# Patient Record
Sex: Female | Born: 1975 | Race: White | Hispanic: No | Marital: Married | Smoking: Never smoker
Health system: Southern US, Community
[De-identification: ages and names within clinical notes are randomized; demographics above are authoritative.]

## PROBLEM LIST (undated history)

## (undated) DIAGNOSIS — Z789 Other specified health status: Secondary | ICD-10-CM

## (undated) HISTORY — PX: WISDOM TOOTH EXTRACTION: SHX21

## (undated) HISTORY — DX: Other specified health status: Z78.9

## (undated) HISTORY — PX: TONSILLECTOMY: SUR1361

---

## 2004-07-07 DIAGNOSIS — O321XX0 Maternal care for breech presentation, not applicable or unspecified: Secondary | ICD-10-CM

## 2007-06-09 ENCOUNTER — Inpatient Hospital Stay (HOSPITAL_COMMUNITY): Admission: AD | Admit: 2007-06-09 | Discharge: 2007-06-11 | Payer: Self-pay | Admitting: Obstetrics and Gynecology

## 2012-07-16 LAB — OB RESULTS CONSOLE GC/CHLAMYDIA
Chlamydia: NEGATIVE
Gonorrhea: NEGATIVE

## 2012-07-16 LAB — OB RESULTS CONSOLE ABO/RH
ABO/RH(D): POSITIVE
RH TYPE: POSITIVE

## 2012-07-16 LAB — OB RESULTS CONSOLE HIV ANTIBODY (ROUTINE TESTING): HIV: NONREACTIVE

## 2012-07-16 LAB — OB RESULTS CONSOLE ANTIBODY SCREEN: Antibody Screen: NEGATIVE

## 2012-07-16 LAB — OB RESULTS CONSOLE RUBELLA ANTIBODY, IGM: Rubella: IMMUNE

## 2012-07-16 LAB — OB RESULTS CONSOLE HEPATITIS B SURFACE ANTIGEN: Hepatitis B Surface Ag: NEGATIVE

## 2012-07-23 LAB — OB RESULTS CONSOLE RPR: RPR: NONREACTIVE

## 2013-01-08 LAB — OB RESULTS CONSOLE GBS: GBS: NEGATIVE

## 2013-02-09 ENCOUNTER — Encounter (HOSPITAL_COMMUNITY): Payer: Self-pay | Admitting: *Deleted

## 2013-02-09 ENCOUNTER — Inpatient Hospital Stay (HOSPITAL_COMMUNITY)
Admission: AD | Admit: 2013-02-09 | Discharge: 2013-02-11 | DRG: 774 | Disposition: A | Discharge status: Home / Self Care | Payer: Medicaid Other | Source: Ambulatory Visit | Attending: Obstetrics and Gynecology | Admitting: Obstetrics and Gynecology

## 2013-02-09 DIAGNOSIS — O9903 Anemia complicating the puerperium: Secondary | ICD-10-CM | POA: Diagnosis not present

## 2013-02-09 DIAGNOSIS — D649 Anemia, unspecified: Secondary | ICD-10-CM | POA: Diagnosis not present

## 2013-02-09 DIAGNOSIS — IMO0001 Reserved for inherently not codable concepts without codable children: Secondary | ICD-10-CM

## 2013-02-09 DIAGNOSIS — O99893 Other specified diseases and conditions complicating puerperium: Secondary | ICD-10-CM | POA: Diagnosis not present

## 2013-02-09 DIAGNOSIS — O9989 Other specified diseases and conditions complicating pregnancy, childbirth and the puerperium: Secondary | ICD-10-CM

## 2013-02-09 DIAGNOSIS — O34219 Maternal care for unspecified type scar from previous cesarean delivery: Secondary | ICD-10-CM | POA: Diagnosis present

## 2013-02-09 DIAGNOSIS — O09529 Supervision of elderly multigravida, unspecified trimester: Secondary | ICD-10-CM | POA: Diagnosis present

## 2013-02-09 DIAGNOSIS — R000 Tachycardia, unspecified: Secondary | ICD-10-CM | POA: Diagnosis not present

## 2013-02-09 DIAGNOSIS — I959 Hypotension, unspecified: Secondary | ICD-10-CM | POA: Diagnosis not present

## 2013-02-09 MED ORDER — ONDANSETRON HCL 4 MG/2ML IJ SOLN: 4.0000 mg | Freq: Four times a day (QID) | INTRAMUSCULAR | Status: DC | PRN

## 2013-02-09 MED ORDER — CITRIC ACID-SODIUM CITRATE 334-500 MG/5ML PO SOLN: 30.0000 mL | ORAL | Status: DC | PRN

## 2013-02-09 MED ORDER — FLEET ENEMA 7-19 GM/118ML RE ENEM: 1.0000 | ENEMA | RECTAL | Status: DC | PRN

## 2013-02-09 MED ORDER — LIDOCAINE HCL (PF) 1 % IJ SOLN
30.0000 mL | INTRAMUSCULAR | Status: AC | PRN
  Administered 2013-02-10 (×2): 30 mL via SUBCUTANEOUS
  Filled 2013-02-09 (×2): qty 30

## 2013-02-09 MED ORDER — ACETAMINOPHEN 325 MG PO TABS: 650.0000 mg | ORAL_TABLET | ORAL | Status: DC | PRN

## 2013-02-09 MED ORDER — OXYTOCIN BOLUS FROM INFUSION
500.0000 mL | INTRAVENOUS | Status: DC
  Administered 2013-02-10: 500 mL via INTRAVENOUS

## 2013-02-09 MED ORDER — LACTATED RINGERS IV SOLN
500.0000 mL | INTRAVENOUS | Status: DC | PRN
  Administered 2013-02-10: 500 mL via INTRAVENOUS

## 2013-02-09 MED ORDER — LACTATED RINGERS IV SOLN
INTRAVENOUS | Status: DC
  Administered 2013-02-10 (×2): via INTRAVENOUS

## 2013-02-09 MED ORDER — OXYTOCIN 40 UNITS IN LACTATED RINGERS INFUSION - SIMPLE MED
62.5000 mL/h | INTRAVENOUS | Status: DC
  Filled 2013-02-09: qty 1000

## 2013-02-09 MED ORDER — IBUPROFEN 600 MG PO TABS
600.0000 mg | ORAL_TABLET | Freq: Four times a day (QID) | ORAL | Status: DC | PRN
  Administered 2013-02-10: 600 mg via ORAL
  Filled 2013-02-09 (×2): qty 1

## 2013-02-09 MED ORDER — OXYCODONE-ACETAMINOPHEN 5-325 MG PO TABS: 1.0000 | ORAL_TABLET | ORAL | Status: DC | PRN

## 2013-02-09 NOTE — MAU Note (Signed)
Pt in for labor evaluation, having contractions every 2 min.

## 2013-02-10 ENCOUNTER — Encounter (HOSPITAL_COMMUNITY): Payer: Self-pay | Admitting: Obstetrics

## 2013-02-10 LAB — CBC
HCT: 43.6 % (ref 36.0–46.0)
HEMATOCRIT: 31.8 % — AB (ref 36.0–46.0)
HEMATOCRIT: 29.7 % — AB (ref 36.0–46.0)
Hemoglobin: 10.5 g/dL — ABNORMAL LOW (ref 12.0–15.0)
Hemoglobin: 11.2 g/dL — ABNORMAL LOW (ref 12.0–15.0)
Hemoglobin: 15.7 g/dL — ABNORMAL HIGH (ref 12.0–15.0)
MCH: 29.7 pg (ref 26.0–34.0)
MCH: 30.6 pg (ref 26.0–34.0)
MCH: 30.7 pg (ref 26.0–34.0)
MCHC: 35.2 g/dL (ref 30.0–36.0)
MCHC: 35.4 g/dL (ref 30.0–36.0)
MCHC: 36 g/dL (ref 30.0–36.0)
MCV: 84.1 fL (ref 78.0–100.0)
MCV: 85 fL (ref 78.0–100.0)
MCV: 87.1 fL (ref 78.0–100.0)
PLATELETS: 328 10*3/uL (ref 150–400)
Platelets: 316 10*3/uL (ref 150–400)
Platelets: 195 10*3/uL (ref 150–400)
RBC: 3.53 MIL/uL — ABNORMAL LOW (ref 3.87–5.11)
RBC: 3.65 MIL/uL — ABNORMAL LOW (ref 3.87–5.11)
RBC: 5.13 MIL/uL — ABNORMAL HIGH (ref 3.87–5.11)
RDW: 12.4 % (ref 11.5–15.5)
RDW: 12.4 % (ref 11.5–15.5)
RDW: 13.7 % (ref 11.5–15.5)
WBC: 15.3 10*3/uL — AB (ref 4.0–10.5)
WBC: 19.8 10*3/uL — ABNORMAL HIGH (ref 4.0–10.5)
WBC: 34.1 10*3/uL — AB (ref 4.0–10.5)

## 2013-02-10 LAB — DIC (DISSEMINATED INTRAVASCULAR COAGULATION)PANEL
Fibrinogen: 306 mg/dL (ref 204–475)
INR: 1.12 (ref 0.00–1.49)
Prothrombin Time: 14.2 seconds (ref 11.6–15.2)
aPTT: 26 seconds (ref 24–37)

## 2013-02-10 LAB — DIC (DISSEMINATED INTRAVASCULAR COAGULATION) PANEL
D DIMER QUANT: 0.97 ug{FEU}/mL — AB (ref 0.00–0.48)
PLATELETS: 316 10*3/uL (ref 150–400)
SMEAR REVIEW: NONE SEEN

## 2013-02-10 LAB — PREPARE RBC (CROSSMATCH)

## 2013-02-10 LAB — POSTPARTUM HEMORRHAGE PROTOCOL (BB NOTIFICATION)

## 2013-02-10 LAB — RPR: RPR Ser Ql: NONREACTIVE

## 2013-02-10 LAB — ABO/RH: ABO/RH(D): A POS

## 2013-02-10 MED ORDER — SIMETHICONE 80 MG PO CHEW: 80.0000 mg | CHEWABLE_TABLET | ORAL | Status: DC | PRN

## 2013-02-10 MED ORDER — METHYLERGONOVINE MALEATE 0.2 MG/ML IJ SOLN
INTRAMUSCULAR | Status: AC
  Administered 2013-02-10: 0.2 mg
  Filled 2013-02-10: qty 1

## 2013-02-10 MED ORDER — ZOLPIDEM TARTRATE 5 MG PO TABS: 5.0000 mg | ORAL_TABLET | Freq: Every evening | ORAL | Status: DC | PRN

## 2013-02-10 MED ORDER — MISOPROSTOL 200 MCG PO TABS
1000.0000 ug | ORAL_TABLET | Freq: Once | ORAL | Status: AC
  Administered 2013-02-10: 1000 ug via RECTAL

## 2013-02-10 MED ORDER — BENZOCAINE-MENTHOL 20-0.5 % EX AERO
1.0000 "application " | INHALATION_SPRAY | CUTANEOUS | Status: DC | PRN
  Filled 2013-02-10: qty 56

## 2013-02-10 MED ORDER — ERYTHROMYCIN 5 MG/GM OP OINT
TOPICAL_OINTMENT | OPHTHALMIC | Status: AC
  Filled 2013-02-10: qty 1

## 2013-02-10 MED ORDER — LACTATED RINGERS IV BOLUS (SEPSIS)
500.0000 mL | Freq: Once | INTRAVENOUS | Status: AC
  Administered 2013-02-10: 500 mL via INTRAVENOUS

## 2013-02-10 MED ORDER — PROMETHAZINE HCL 25 MG/ML IJ SOLN
12.5000 mg | Freq: Once | INTRAMUSCULAR | Status: AC
  Administered 2013-02-10: 12.5 mg via INTRAVENOUS
  Filled 2013-02-10: qty 1

## 2013-02-10 MED ORDER — MEASLES, MUMPS & RUBELLA VAC ~~LOC~~ INJ
0.5000 mL | INJECTION | Freq: Once | SUBCUTANEOUS | Status: DC
Start: 2013-02-11 — End: 2013-02-10

## 2013-02-10 MED ORDER — DIBUCAINE 1 % RE OINT: 1.0000 "application " | TOPICAL_OINTMENT | RECTAL | Status: DC | PRN

## 2013-02-10 MED ORDER — BUTORPHANOL TARTRATE 1 MG/ML IJ SOLN
INTRAMUSCULAR | Status: AC
  Administered 2013-02-10: 1 mg via INTRAVENOUS
  Filled 2013-02-10: qty 1

## 2013-02-10 MED ORDER — CEFAZOLIN SODIUM-DEXTROSE 2-3 GM-% IV SOLR
2.0000 g | Freq: Once | INTRAVENOUS | Status: AC
  Administered 2013-02-10: 2 g via INTRAVENOUS
  Filled 2013-02-10: qty 50

## 2013-02-10 MED ORDER — ONDANSETRON HCL 4 MG PO TABS
4.0000 mg | ORAL_TABLET | ORAL | Status: DC | PRN
Start: 2013-02-10 — End: 2013-02-11

## 2013-02-10 MED ORDER — LANOLIN HYDROUS EX OINT
TOPICAL_OINTMENT | CUTANEOUS | Status: DC | PRN
Start: 2013-02-10 — End: 2013-02-11

## 2013-02-10 MED ORDER — OXYCODONE-ACETAMINOPHEN 5-325 MG PO TABS: 1.0000 | ORAL_TABLET | ORAL | Status: DC | PRN

## 2013-02-10 MED ORDER — FERROUS SULFATE 325 (65 FE) MG PO TABS
325.0000 mg | ORAL_TABLET | Freq: Two times a day (BID) | ORAL | Status: DC
  Administered 2013-02-10 – 2013-02-11 (×2): 325 mg via ORAL
  Filled 2013-02-10 (×2): qty 1

## 2013-02-10 MED ORDER — BUTORPHANOL TARTRATE 1 MG/ML IJ SOLN
1.0000 mg | Freq: Once | INTRAMUSCULAR | Status: AC
  Administered 2013-02-10: 1 mg via INTRAVENOUS

## 2013-02-10 MED ORDER — IBUPROFEN 600 MG PO TABS
600.0000 mg | ORAL_TABLET | Freq: Four times a day (QID) | ORAL | Status: DC
  Administered 2013-02-10 – 2013-02-11 (×4): 600 mg via ORAL
  Filled 2013-02-10 (×4): qty 1

## 2013-02-10 MED ORDER — TETANUS-DIPHTH-ACELL PERTUSSIS 5-2.5-18.5 LF-MCG/0.5 IM SUSP: 0.5000 mL | Freq: Once | INTRAMUSCULAR | Status: DC

## 2013-02-10 MED ORDER — MISOPROSTOL 200 MCG PO TABS
ORAL_TABLET | ORAL | Status: AC
  Filled 2013-02-10: qty 5

## 2013-02-10 MED ORDER — ONDANSETRON HCL 4 MG/2ML IJ SOLN: 4.0000 mg | INTRAMUSCULAR | Status: DC | PRN

## 2013-02-10 MED ORDER — DIPHENHYDRAMINE HCL 25 MG PO CAPS
25.0000 mg | ORAL_CAPSULE | Freq: Four times a day (QID) | ORAL | Status: DC | PRN
Start: 2013-02-10 — End: 2013-02-11

## 2013-02-10 MED ORDER — PRENATAL MULTIVITAMIN CH
1.0000 | ORAL_TABLET | Freq: Every day | ORAL | Status: DC
  Administered 2013-02-10 – 2013-02-11 (×2): 1 via ORAL
  Filled 2013-02-10 (×2): qty 1

## 2013-02-10 MED ORDER — WITCH HAZEL-GLYCERIN EX PADS: 1.0000 "application " | MEDICATED_PAD | CUTANEOUS | Status: DC | PRN

## 2013-02-10 MED ORDER — SENNOSIDES-DOCUSATE SODIUM 8.6-50 MG PO TABS
2.0000 | ORAL_TABLET | ORAL | Status: DC
  Administered 2013-02-10: 2 via ORAL
  Filled 2013-02-10: qty 2

## 2013-02-10 NOTE — Progress Notes (Addendum)
Blood transfusion initiated. Consent obtained by patient. Dr. Normand Sloopillard requesting for blood to be transfused rapidly. House supervisor and CRNA at bedside to assist.

## 2013-02-10 NOTE — Progress Notes (Addendum)
Patient ID: Erin LarocheJessica S Bridges, female   DOB: April 07, 1975, 38 y.o.   MRN: [id number] Called to help [name] with third degree laceration.  Pt had 1000 cc blood loss.  After repair she became hypotensive and tachycardic BP 76/35  Pulse 132  Temp(Src) 97.7 F (36.5 C) (Axillary)  Resp 20  Ht 5\' 10"  (1.778 m)  Wt 215 lb (97.523 kg)  BMI 30.85 kg/m2  SpO2 86% ABD fundus firm 2 below umbilicus Vagina another 500 cc clot came out of vagina Pt given 1000 mcg cytotec 0.2mg  methergine And  40 mu pitocin Uterus explored and another 500 cc clot  Removed with small retained products CBC    Component Value Date/Time   WBC 34.1* 02/10/2013 0250   RBC 3.65* 02/10/2013 0250   HGB 11.2* 02/10/2013 0250   HCT 31.8* 02/10/2013 0250   PLT 316 02/10/2013 0250   MCV 87.1 02/10/2013 0250   MCH 30.7 02/10/2013 0250   MCHC 35.2 02/10/2013 0250   RDW 12.4 02/10/2013 0250    PP Hemmorrhage Pt given two units PRBCS Two IV sites with fluid started.   Pt denies any pain I dont think it is a uterine rupture Total blood loss 2000cc Lab called ans stated lab was 7.  Nurse came into the room and stated the patients hemoglobin is 7.  After review of the lab her HGB is 11.2

## 2013-02-10 NOTE — Progress Notes (Signed)
Code hemorrhage called. DIC panel drawn as well. BB notified of 2 units to be type and crossed. Second IV being obtained. Pt NPO. O2 administered via face mask. Anesthesia and house supervisor at bedside along with attending Dr Normand Sloopillard and Gevena BarreS Lillard CNM

## 2013-02-10 NOTE — Lactation Note (Signed)
This note was copied from the chart of Erin York SpanielJessica Allerton. Lactation Consultation Note: Initial visit with mom. Baby fussy- Dad putting her clothes on. Reports that baby has been feeding on and off for the last hour. Mom reports there is some pain with latch- asking about baby's tongue. Baby only able to lift tongue slightly and tongue does not come out past gums. Encouraged to ask Ped about this. Encouraged mom to drink plenty of fluids due to blood loss. Experienced BF mom. No questions at present. Encouraged to watch for feeding cues and feed whenever she sees them. LS 8 by RN. BF brochure given with resources for support after DC. To call for assist prn  Patient Name: Erin Bridges [id number]'s Date: 02/10/2013 Reason for consult: Initial assessment   Maternal Data Formula Feeding for Exclusion: No Infant to breast within first hour of birth: Yes Does the patient have breastfeeding experience prior to this delivery?: Yes  Feeding Feeding Type: Breast Fed  LATCH Score/Interventions  Lactation Tools Discussed/Used     Consult Status Consult Status: Follow-up Date: 02/11/13 Follow-up type: In-patient    Pamelia HoitWeeks, Ismelda Weatherman D 02/10/2013, 4:24 PM

## 2013-02-10 NOTE — Progress Notes (Signed)
Postpartum hemorrhage protocol in place. Awaiting cbc. LR bolusing per Dr Orvan Julyillards orders. O2 administered. Monitoring BP. PR cytotec given per dr Normand Sloopdillard.

## 2013-02-10 NOTE — H&P (Signed)
[name] is a 38 y.o. female presenting for reg strong ctx, now 2-3 min apart. Denies VB or LOF, GFM.   Maternal Medical History:  Reason for admission: Contractions.   Contractions: Onset was 3-5 hours ago.   Frequency: regular.   Perceived severity is strong.    Fetal activity: Perceived fetal activity is normal.   Last perceived fetal movement was within the past hour.    Prenatal complications: no prenatal complications Prenatal Complications - Diabetes: none.    OB History   Grav Para Term Preterm Abortions TAB SAB Ect Mult Living   3 3 3       3      History reviewed. No pertinent past medical history. Past Surgical History  Procedure Laterality Date  . Cesarean section    . Tonsillectomy    . Wisdom tooth extraction     Family History: family history is not on file. Social History:  reports that she has never smoked. She does not have any smokeless tobacco history on file. She reports that she does not drink alcohol or use illicit drugs.   Prenatal Transfer Tool  Maternal Diabetes: No Genetic Screening: Normal Maternal Ultrasounds/Referrals: Normal Fetal Ultrasounds or other Referrals:  None Maternal Substance Abuse:  No Significant Maternal Medications:  no Significant Maternal Lab Results:  Lab values include: Group B Strep negative Other Comments:  None  Review of Systems  All other systems reviewed and are negative.    Dilation: 10 Effacement (%): 100 Station: Crowning Exam by:: Izaah Westman cnm Blood pressure 92/66, pulse 133, temperature 97.4 F (36.3 C), temperature source Oral, resp. rate 18, height 5\' 10"  (1.778 m), weight 215 lb (97.523 kg), SpO2 99.00%, unknown if currently breastfeeding. Maternal Exam:  Uterine Assessment: Contraction strength is firm.  Contraction frequency is regular.   Abdomen: Patient reports no abdominal tenderness. Fundal height is aga.   Estimated fetal weight is 7-8.   Fetal presentation: vertex  Introitus:  Normal vulva. Normal vagina.  Pelvis: adequate for delivery.   Cervix: Cervix evaluated by digital exam.     Fetal Exam Fetal Monitor Review: Mode: ultrasound.    Fetal State Assessment: Category I - tracings are normal.     Physical Exam  Nursing note and vitals reviewed. Constitutional: She is oriented to person, place, and time. She appears well-developed and well-nourished. She appears distressed.  HENT:  Head: Normocephalic.  Eyes: Pupils are equal, round, and reactive to light.  Neck: Normal range of motion.  Cardiovascular: Normal rate, regular rhythm and normal heart sounds.   Respiratory: Effort normal and breath sounds normal.  GI: Soft. Bowel sounds are normal.  Genitourinary: Vagina normal.  Musculoskeletal: Normal range of motion.  Neurological: She is alert and oriented to person, place, and time. She has normal reflexes.  Skin: Skin is warm and dry.  Psychiatric: She has a normal mood and affect. Her behavior is normal.    Prenatal labs: ABO, Rh: --/--/A POS, A POS (01/05 0000) Antibody: NEG (01/05 0000) Rubella: Immune (06/10 0000) RPR: Nonreactive (06/17 0000)  HBsAg: Negative (06/10 0000)  HIV: Non-reactive (06/10 0000)  GBS: Negative (12/03 0000)   Assessment/Plan: IUP at 784w2d Active labor GBS neg FHR cat 1 Declines medications Hx c/s, and subsequent VBAC  Admit to b.s per c/w Dr Normand Sloopillard Routine L&D orders Imminent delivery    Razia Screws M 02/10/2013, 5:48 AM

## 2013-02-11 LAB — TYPE AND SCREEN
ABO/RH(D): A POS
ANTIBODY SCREEN: NEGATIVE
UNIT DIVISION: 0
Unit division: 0

## 2013-02-11 LAB — CBC
HCT: 24 % — ABNORMAL LOW (ref 36.0–46.0)
Hemoglobin: 8.5 g/dL — ABNORMAL LOW (ref 12.0–15.0)
MCH: 30.5 pg (ref 26.0–34.0)
MCHC: 35.4 g/dL (ref 30.0–36.0)
MCV: 86 fL (ref 78.0–100.0)
PLATELETS: 187 10*3/uL (ref 150–400)
RBC: 2.79 MIL/uL — ABNORMAL LOW (ref 3.87–5.11)
RDW: 14.1 % (ref 11.5–15.5)
WBC: 13.9 10*3/uL — AB (ref 4.0–10.5)

## 2013-02-11 MED ORDER — IBUPROFEN 600 MG PO TABS: 600.0000 mg | ORAL_TABLET | Freq: Four times a day (QID) | ORAL | Status: DC | PRN

## 2013-02-11 MED ORDER — OXYCODONE-ACETAMINOPHEN 5-325 MG PO TABS: 1.0000 | ORAL_TABLET | Freq: Four times a day (QID) | ORAL | Status: DC | PRN

## 2013-02-11 NOTE — Discharge Summary (Addendum)
Obstetric Discharge Summary Reason for Admission: onset of labor Prenatal Procedures: ultrasound Intrapartum Procedures: spontaneous vaginal delivery Postpartum Procedures: none Complications-Operative and Postpartum: none Hemoglobin  Date Value Range Status  02/11/2013 8.5* 12.0 - 15.0 g/dL Final     REPEATED TO VERIFY     DELTA CHECK NOTED     HCT  Date Value Range Status  02/11/2013 24.0* 36.0 - 46.0 % Final   Breast feeding.  Undecided about BC.  Asymptomatic anemia.  Pt interested in discharge today.  Physical Exam:  General: alert and no distress Lochia: appropriate Uterine Fundus: firm DVT Evaluation: No evidence of DVT seen on physical exam.  Discharge Diagnoses: Term Pregnancy-delivered  Discharge Information: Date: 02/11/2013 Activity: pelvic rest Diet: routine Medications: Ibuprofen and Percocet Condition: stable Instructions: refer to practice specific booklet Discharge to: home Follow-up Information   [facility] 6 weeks. (for post partum visit)    Specialty:  Obstetrics and Gynecology   Contact information:   [address] [place] [id number] [phone]      Newborn Data: Live born female  Birth Weight: 8 lb 0.8 oz (3651 g) APGAR: 9, 9  Home with mother.  Erin Bridges Y 02/11/2013, 11:57 AM

## 2013-12-08 ENCOUNTER — Encounter (HOSPITAL_COMMUNITY): Payer: Self-pay | Admitting: Obstetrics

## 2015-02-07 NOTE — L&D Delivery Note (Signed)
Delivery Note Patient complete and pushing. At 10:06 PM patient had VBAC of viable female infant over intact perineum. Nuchal cord x 1 reduced. Infant delivered to mom's abdomen. Delayed cord clamping x 1 minute. Cord clamped x 2, cut. Spontaneous cry heard. Weight and Apgars 8, 9. Cord blood obtained. Placenta delivered spontaneously and intact. LUS cleared of clot and retained membane Vagina inspected.2nd  degree lacerations noted. Repaired with 3-0 Vicryl Rapide EBL: 300 cc Anesthesia: epidural, local  Mom to postpartum.  Baby to Couplet care / Skin to Skin.  Erin Bridges 11/06/2015, 10:24 PM

## 2015-03-24 LAB — OB RESULTS CONSOLE GC/CHLAMYDIA
CHLAMYDIA, DNA PROBE: NEGATIVE
GC PROBE AMP, GENITAL: NEGATIVE

## 2015-03-24 LAB — OB RESULTS CONSOLE HIV ANTIBODY (ROUTINE TESTING): HIV: NONREACTIVE

## 2015-03-24 LAB — OB RESULTS CONSOLE HEPATITIS B SURFACE ANTIGEN: Hepatitis B Surface Ag: NEGATIVE

## 2015-03-24 LAB — OB RESULTS CONSOLE RPR: RPR: NONREACTIVE

## 2015-04-29 LAB — CYTOLOGY - PAP: PAP SMEAR: NEGATIVE

## 2015-08-12 LAB — OB RESULTS CONSOLE HEPATITIS B SURFACE ANTIGEN: Hepatitis B Surface Ag: NEGATIVE

## 2015-08-12 LAB — GLUCOSE TOLERANCE, 1 HOUR: GTT, 1 HR: 92

## 2015-08-12 LAB — OB RESULTS CONSOLE HGB/HCT, BLOOD
HEMATOCRIT: 38 %
HEMOGLOBIN: 12.5 g/dL

## 2015-08-12 LAB — OB RESULTS CONSOLE RPR: RPR: NONREACTIVE

## 2015-08-12 LAB — OB RESULTS CONSOLE ANTIBODY SCREEN: Antibody Screen: NEGATIVE

## 2015-08-12 LAB — OB RESULTS CONSOLE PLATELET COUNT: PLATELETS: 280 10*3/uL

## 2015-08-12 LAB — OB RESULTS CONSOLE GC/CHLAMYDIA
CHLAMYDIA, DNA PROBE: NEGATIVE
GC PROBE AMP, GENITAL: NEGATIVE

## 2015-08-12 LAB — OB RESULTS CONSOLE ABO/RH: RH Type: POSITIVE

## 2015-08-12 LAB — OB RESULTS CONSOLE RUBELLA ANTIBODY, IGM: Rubella: IMMUNE

## 2015-09-14 ENCOUNTER — Encounter: Payer: Self-pay | Admitting: Certified Nurse Midwife

## 2015-09-14 ENCOUNTER — Ambulatory Visit (INDEPENDENT_AMBULATORY_CARE_PROVIDER_SITE_OTHER): Payer: Medicaid Other | Admitting: Certified Nurse Midwife

## 2015-09-14 VITALS — BP 105/61 | HR 61 | Temp 97.1°F | Wt 217.4 lb

## 2015-09-14 DIAGNOSIS — Z3483 Encounter for supervision of other normal pregnancy, third trimester: Secondary | ICD-10-CM

## 2015-09-14 DIAGNOSIS — O0993 Supervision of high risk pregnancy, unspecified, third trimester: Secondary | ICD-10-CM

## 2015-09-14 DIAGNOSIS — B373 Candidiasis of vulva and vagina: Secondary | ICD-10-CM

## 2015-09-14 DIAGNOSIS — Z3490 Encounter for supervision of normal pregnancy, unspecified, unspecified trimester: Secondary | ICD-10-CM | POA: Insufficient documentation

## 2015-09-14 DIAGNOSIS — B3731 Acute candidiasis of vulva and vagina: Secondary | ICD-10-CM

## 2015-09-14 LAB — POCT URINALYSIS DIPSTICK
Bilirubin, UA: NEGATIVE
Blood, UA: NEGATIVE
Glucose, UA: NEGATIVE
KETONES UA: NEGATIVE
Leukocytes, UA: NEGATIVE
Nitrite, UA: NEGATIVE
Protein, UA: NEGATIVE
Spec Grav, UA: 1.015
Urobilinogen, UA: 0.2
pH, UA: 6

## 2015-09-14 MED ORDER — TERCONAZOLE 0.8 % VA CREA: 1.0000 | TOPICAL_CREAM | Freq: Every day | VAGINAL | 0 refills | Status: DC

## 2015-09-14 MED ORDER — FLUCONAZOLE 100 MG PO TABS: 100.0000 mg | ORAL_TABLET | Freq: Once | ORAL | 0 refills | Status: AC

## 2015-09-14 NOTE — Progress Notes (Signed)
Pt c/o vaginal irritation/burning. She also c/o minimal pain/pressure.

## 2015-09-14 NOTE — Progress Notes (Signed)
Subjective:    Erin LarocheJessica S Bridges is being seen today for her first obstetrical visit.  This is not a planned pregnancy. She is at 192w4d gestation. Her obstetrical history is significant for advanced maternal age. Relationship with FOB: spouse, living together. Patient does intend to breast feed. Pregnancy history fully reviewed.  The information documented in the HPI was reviewed and verified.  Menstrual History: OB History    Gravida Para Term Preterm AB Living   4 3 3     3    SAB TAB Ectopic Multiple Live Births           3    VBAC with 2nd and 3rd deliveries.  1st child was planned C-section: breech.  8#1 oz child last time, was not induced.     Patient's last menstrual period was 01/22/2015 (approximate).    Past Medical History:  Diagnosis Date  . Medical history non-contributory     Past Surgical History:  Procedure Laterality Date  . CESAREAN SECTION    . TONSILLECTOMY    . WISDOM TOOTH EXTRACTION       (Not in a hospital admission) No Known Allergies  Social History  Substance Use Topics  . Smoking status: Never Smoker  . Smokeless tobacco: Never Used  . Alcohol use No    Family History  Problem Relation Age of Onset  . Cancer Mother   . Hypertension Mother   . Diverticulitis Father   . Tuberculosis Maternal Grandmother   . Heart disease Maternal Grandfather      Review of Systems Constitutional: negative for weight loss Gastrointestinal: negative for vomiting Genitourinary:negative for genital lesions and vaginal discharge and dysuria Musculoskeletal:negative for back pain Behavioral/Psych: negative for abusive relationship, depression, illegal drug usage and tobacco use    Objective:    BP 105/61   Pulse 61   Temp 97.1 F (36.2 C)   Wt 217 lb 6.4 oz (98.6 kg)   LMP 01/22/2015 (Approximate)   BMI 31.19 kg/m  General Appearance:    Alert, cooperative, no distress, appears stated age  Head:    Normocephalic, without obvious abnormality,  atraumatic  Eyes:    PERRL, conjunctiva/corneas clear, EOM's intact, fundi    benign, both eyes  Ears:    Normal TM's and external ear canals, both ears  Nose:   Nares normal, septum midline, mucosa normal, no drainage    or sinus tenderness  Throat:   Lips, mucosa, and tongue normal; teeth and gums normal  Neck:   Supple, symmetrical, trachea midline, no adenopathy;    thyroid:  no enlargement/tenderness/nodules; no carotid   bruit or JVD  Back:     Symmetric, no curvature, ROM normal, no CVA tenderness  Lungs:     Clear to auscultation bilaterally, respirations unlabored  Chest Wall:    No tenderness or deformity   Heart:    Regular rate and rhythm, S1 and S2 normal, no murmur, rub   or gallop  Breast Exam:    No tenderness, masses, or nipple abnormality  Abdomen:     Soft, non-tender, bowel sounds active all four quadrants,    no masses, no organomegaly  Genitalia:    Normal female without lesion, discharge or tenderness, Vaginal irritation present, + chunky white discharge noted, no odor.   Extremities:   Extremities normal, atraumatic, no cyanosis or edema  Pulses:   2+ and symmetric all extremities  Skin:   Skin color, texture, turgor normal, no rashes or lesions  Lymph nodes:  Cervical, supraclavicular, and axillary nodes normal  Neurologic:   CNII-XII intact, normal strength, sensation and reflexes    throughout      Cervix:  0 .5 cm, long, thick, posterior.  FHR: 148 by doppler, FH: 33 cm  Lab Review Urine pregnancy test Labs reviewed yes Radiologic studies reviewed yes Assessment:    Pregnancy at [redacted]w[redacted]d weeks   Transfer from [facility]    Vaginal yeast infection  Plan:      Prenatal vitamins.  Counseling provided regarding continued use of seat belts, cessation of alcohol consumption, smoking or use of illicit drugs; infection precautions i.e., influenza/TDAP immunizations, toxoplasmosis,CMV, parvovirus, listeria and varicella; workplace safety, exercise during  pregnancy; routine dental care, safe medications, sexual activity, hot tubs, saunas, pools, travel, caffeine use, fish and methlymercury, potential toxins, hair treatments, varicose veins Weight gain recommendations per IOM guidelines reviewed: underweight/BMI< 18.5--> gain 28 - 40 lbs; normal weight/BMI 18.5 - 24.9--> gain 25 - 35 lbs; overweight/BMI 25 - 29.9--> gain 15 - 25 lbs; obese/BMI >30->gain  11 - 20 lbs Problem list reviewed and updated. FIRST/CF mutation testing/NIPT/QUAD SCREEN/fragile X/Ashkenazi Jewish population testing/Spinal muscular atrophy discussed: declined. Role of ultrasound in pregnancy discussed; fetal survey: ordered. Amniocentesis discussed: not indicated. VBAC calculator score: VBAC consent form provided No orders of the defined types were placed in this encounter.  No orders of the defined types were placed in this encounter.   Follow up in 2 weeks.  50% of 30 min visit spent on counseling and coordination of care.

## 2015-09-17 LAB — NUSWAB VG+, CANDIDA 6SP
CANDIDA KRUSEI, NAA: NEGATIVE
Candida albicans, NAA: NEGATIVE
Candida glabrata, NAA: NEGATIVE
Candida lusitaniae, NAA: NEGATIVE
Candida parapsilosis, NAA: NEGATIVE
Candida tropicalis, NAA: NEGATIVE
Chlamydia trachomatis, NAA: NEGATIVE
Neisseria gonorrhoeae, NAA: NEGATIVE
TRICH VAG BY NAA: NEGATIVE

## 2015-09-21 ENCOUNTER — Encounter (HOSPITAL_COMMUNITY): Payer: Self-pay | Admitting: Obstetrics

## 2015-09-21 ENCOUNTER — Encounter (HOSPITAL_COMMUNITY): Payer: Self-pay | Admitting: Certified Nurse Midwife

## 2015-09-28 ENCOUNTER — Other Ambulatory Visit: Payer: Self-pay | Admitting: Certified Nurse Midwife

## 2015-09-28 ENCOUNTER — Encounter: Payer: Medicaid Other | Admitting: Certified Nurse Midwife

## 2015-09-28 ENCOUNTER — Ambulatory Visit (INDEPENDENT_AMBULATORY_CARE_PROVIDER_SITE_OTHER): Payer: Medicaid Other | Admitting: Certified Nurse Midwife

## 2015-09-28 ENCOUNTER — Encounter (HOSPITAL_COMMUNITY): Payer: Self-pay

## 2015-09-28 ENCOUNTER — Ambulatory Visit (HOSPITAL_COMMUNITY)
Admission: RE | Admit: 2015-09-28 | Discharge: 2015-09-28 | Disposition: A | Discharge status: Home / Self Care | Payer: Medicaid Other | Source: Ambulatory Visit | Attending: Certified Nurse Midwife | Admitting: Certified Nurse Midwife

## 2015-09-28 VITALS — BP 124/66 | HR 74 | Wt 220.0 lb

## 2015-09-28 DIAGNOSIS — Z3A35 35 weeks gestation of pregnancy: Secondary | ICD-10-CM | POA: Diagnosis not present

## 2015-09-28 DIAGNOSIS — O34219 Maternal care for unspecified type scar from previous cesarean delivery: Secondary | ICD-10-CM

## 2015-09-28 DIAGNOSIS — O0993 Supervision of high risk pregnancy, unspecified, third trimester: Secondary | ICD-10-CM

## 2015-09-28 DIAGNOSIS — Z3483 Encounter for supervision of other normal pregnancy, third trimester: Secondary | ICD-10-CM

## 2015-09-28 DIAGNOSIS — O09523 Supervision of elderly multigravida, third trimester: Secondary | ICD-10-CM | POA: Diagnosis not present

## 2015-09-28 LAB — POCT URINALYSIS DIPSTICK
Bilirubin, UA: NEGATIVE
Glucose, UA: NEGATIVE
Ketones, UA: NEGATIVE
LEUKOCYTES UA: NEGATIVE
NITRITE UA: NEGATIVE
PH UA: 7
Protein, UA: NEGATIVE
RBC UA: NEGATIVE
Spec Grav, UA: <=1.005
UROBILINOGEN UA: NEGATIVE

## 2015-09-28 NOTE — Progress Notes (Signed)
Subjective:    Erin Bridges is a 40 y.o. female being seen today for her obstetrical visit. She is at 876w3d gestation. Patient reports no complaints. Fetal movement: normal.  Problem List Items Addressed This Visit    None    Visit Diagnoses   None.    Patient Active Problem List   Diagnosis Date Noted  . Encounter for supervision of other normal pregnancy in third trimester 09/14/2015  . NSVD (normal spontaneous vaginal delivery) 02/10/2013  . PPH (postpartum hemorrhage) 02/10/2013  . Vaginal delivery 02/10/2013   Objective:    BP 124/66   Pulse 74   Wt 220 lb (99.8 kg)   LMP 01/22/2015 (Approximate)   BMI 31.57 kg/m  FHT:  140 BPM  Uterine Size: 35 cm and size equals dates  Presentation: cephalic     Assessment:    Pregnancy @ 676w3d weeks   AMA  Doing well  Plan:     labs reviewed, problem list updated Consent signed for TOLAC. GBS planning for next week per patients request TDAP offered  [facility] Pediatrician: discussed. Infant feeding: plans to breastfeed. Maternity leave: N/A. Cigarette smoking: never smoked. No orders of the defined types were placed in this encounter.  No orders of the defined types were placed in this encounter.  Follow up in 1 Week with NST & GBS.

## 2015-09-28 NOTE — Addendum Note (Signed)
Addended by: Marya LandryFOSTER, Calil Amor D on: 09/28/2015 11:26 AM   Modules accepted: Orders

## 2015-09-28 NOTE — Patient Instructions (Addendum)
Third Trimester of Pregnancy The third trimester is from week 29 through week 42, months 7 through 9. The third trimester is a time when the fetus is growing rapidly. At the end of the ninth month, the fetus is about 20 inches in length and weighs 6-10 pounds.  BODY CHANGES Your body goes through many changes during pregnancy. The changes vary from woman to woman.   Your weight will continue to increase. You can expect to gain 25-35 pounds (11-16 kg) by the end of the pregnancy.  You may begin to get stretch marks on your hips, abdomen, and breasts.  You may urinate more often because the fetus is moving lower into your pelvis and pressing on your bladder.  You may develop or continue to have heartburn as a result of your pregnancy.  You may develop constipation because certain hormones are causing the muscles that push waste through your intestines to slow down.  You may develop hemorrhoids or swollen, bulging veins (varicose veins).  You may have pelvic pain because of the weight gain and pregnancy hormones relaxing your joints between the bones in your pelvis. Backaches may result from overexertion of the muscles supporting your posture.  You may have changes in your hair. These can include thickening of your hair, rapid growth, and changes in texture. Some women also have hair loss during or after pregnancy, or hair that feels dry or thin. Your hair will most likely return to normal after your baby is born.  Your breasts will continue to grow and be tender. A yellow discharge may leak from your breasts called colostrum.  Your belly button may stick out.  You may feel short of breath because of your expanding uterus.  You may notice the fetus "dropping," or moving lower in your abdomen.  You may have a bloody mucus discharge. This usually occurs a few days to a week before labor begins.  Your cervix becomes thin and soft (effaced) near your due date. WHAT TO EXPECT AT YOUR PRENATAL  EXAMS  You will have prenatal exams every 2 weeks until week 36. Then, you will have weekly prenatal exams. During a routine prenatal visit:  You will be weighed to make sure you and the fetus are growing normally.  Your blood pressure is taken.  Your abdomen will be measured to track your baby's growth.  The fetal heartbeat will be listened to.  Any test results from the previous visit will be discussed.  You may have a cervical check near your due date to see if you have effaced. At around 36 weeks, your caregiver will check your cervix. At the same time, your caregiver will also perform a test on the secretions of the vaginal tissue. This test is to determine if a type of bacteria, Group B streptococcus, is present. Your caregiver will explain this further. Your caregiver may ask you:  What your birth plan is.  How you are feeling.  If you are feeling the baby move.  If you have had any abnormal symptoms, such as leaking fluid, bleeding, severe headaches, or abdominal cramping.  If you are using any tobacco products, including cigarettes, chewing tobacco, and electronic cigarettes.  If you have any questions. Other tests or screenings that may be performed during your third trimester include:  Blood tests that check for low iron levels (anemia).  Fetal testing to check the health, activity level, and growth of the fetus. Testing is done if you have certain medical conditions or if   there are problems during the pregnancy.  HIV (human immunodeficiency virus) testing. If you are at high risk, you may be screened for HIV during your third trimester of pregnancy. FALSE LABOR You may feel small, irregular contractions that eventually go away. These are called Braxton Hicks contractions, or false labor. Contractions may last for hours, days, or even weeks before true labor sets in. If contractions come at regular intervals, intensify, or become painful, it is best to be seen by your  caregiver.  SIGNS OF LABOR   Menstrual-like cramps.  Contractions that are 5 minutes apart or less.  Contractions that start on the top of the uterus and spread down to the lower abdomen and back.  A sense of increased pelvic pressure or back pain.  A watery or bloody mucus discharge that comes from the vagina. If you have any of these signs before the 37th week of pregnancy, call your caregiver right away. You need to go to the hospital to get checked immediately. HOME CARE INSTRUCTIONS   Avoid all smoking, herbs, alcohol, and unprescribed drugs. These chemicals affect the formation and growth of the baby.  Do not use any tobacco products, including cigarettes, chewing tobacco, and electronic cigarettes. If you need help quitting, ask your health care provider. You may receive counseling support and other resources to help you quit.  Follow your caregiver's instructions regarding medicine use. There are medicines that are either safe or unsafe to take during pregnancy.  Exercise only as directed by your caregiver. Experiencing uterine cramps is a good sign to stop exercising.  Continue to eat regular, healthy meals.  Wear a good support bra for breast tenderness.  Do not use hot tubs, steam rooms, or saunas.  Wear your seat belt at all times when driving.  Avoid raw meat, uncooked cheese, cat litter boxes, and soil used by cats. These carry germs that can cause birth defects in the baby.  Take your prenatal vitamins.  Take 1500-2000 mg of calcium daily starting at the 20th week of pregnancy until you deliver your baby.  Try taking a stool softener (if your caregiver approves) if you develop constipation. Eat more high-fiber foods, such as fresh vegetables or fruit and whole grains. Drink plenty of fluids to keep your urine clear or pale yellow.  Take warm sitz baths to soothe any pain or discomfort caused by hemorrhoids. Use hemorrhoid cream if your caregiver approves.  If  you develop varicose veins, wear support hose. Elevate your feet for 15 minutes, 3-4 times a day. Limit salt in your diet.  Avoid heavy lifting, wear low heal shoes, and practice good posture.  Rest a lot with your legs elevated if you have leg cramps or low back pain.  Visit your dentist if you have not gone during your pregnancy. Use a soft toothbrush to brush your teeth and be gentle when you floss.  A sexual relationship may be continued unless your caregiver directs you otherwise.  Do not travel far distances unless it is absolutely necessary and only with the approval of your caregiver.  Take prenatal classes to understand, practice, and ask questions about the labor and delivery.  Make a trial run to the hospital.  Pack your hospital bag.  Prepare the baby's nursery.  Continue to go to all your prenatal visits as directed by your caregiver. SEEK MEDICAL CARE IF:  You are unsure if you are in labor or if your water has broken.  You have dizziness.  You have   mild pelvic cramps, pelvic pressure, or nagging pain in your abdominal area.  You have persistent nausea, vomiting, or diarrhea.  You have a bad smelling vaginal discharge.  You have pain with urination. SEEK IMMEDIATE MEDICAL CARE IF:   You have a fever.  You are leaking fluid from your vagina.  You have spotting or bleeding from your vagina.  You have severe abdominal cramping or pain.  You have rapid weight loss or gain.  You have shortness of breath with chest pain.  You notice sudden or extreme swelling of your face, hands, ankles, feet, or legs.  You have not felt your baby move in over an hour.  You have severe headaches that do not go away with medicine.  You have vision changes.   This information is not intended to replace advice given to you by your health care provider. Make sure you discuss any questions you have with your health care provider.   Document Released: 01/17/2001 Document  Revised: 02/13/2014 Document Reviewed: 03/26/2012 Elsevier Interactive Patient Education 2016 [employer]  Before Baby Comes Home Before your baby arrives it is important to:  Have all of the supplies that you will need to care for your baby.  Know where to go if there is an emergency.  Discuss the baby's arrival with other family members. WHAT SUPPLIES WILL I NEED? It is recommended that you have the following supplies: Large Items  Crib.  Crib mattress.  Rear-facing infant car seat. If possible, have a trained professional check to make sure that it is installed correctly. Feeding  6-8 bottles that are 4-5 oz in size.  6-8 nipples.  Bottle brush.  Sterilizer, or a large pan or kettle with a lid.  A way to boil and cool water.  If you will be breastfeeding:  Breast pump.  Nipple cream.  Nursing bra.  Breast pads.  Breast shields.  If you will be formula feeding:  Formula.  Measuring cups.  Measuring spoons. Bathing  Mild baby soap and baby shampoo.  Petroleum jelly.  Soft cloth towel and washcloth.  Hooded towel.  Cotton balls.  Bath basin. Other Supplies  Rectal thermometer.  Bulb syringe.  Baby wipes or washcloths for diaper changes.  Diaper bag.  Changing pad.  Clothing, including one-piece outfits and pajamas.  Baby nail clippers.  Receiving blankets.  Mattress pad and sheets for the crib.  Night-light for the baby's room.  Baby monitor.  2 or 3 pacifiers.  Either 24-36 cloth diapers and waterproof diaper covers or a box of disposable diapers. You may need to use as many as 10-12 diapers per day. HOW DO I PREPARE FOR AN EMERGENCY? Prepare for an emergency by:  Knowing how to get to the nearest hospital.  Listing the phone numbers of your baby's health care providers near your home phone and in your cell phone. HOW DO I PREPARE MY FAMILY?  Decide how to handle visitors.  If you have other children:  Talk  with them about the baby coming home. Ask them how they feel about it.  Read a book together about being a new big brother or sister.  Find ways to let them help you prepare for the new baby.  Have someone ready to care for them while you are in the hospital.   This information is not intended to replace advice given to you by your health care provider. Make sure you discuss any questions you have with your health care provider.     Document Released: 01/06/2008 Document Revised: 06/09/2014 Document Reviewed: 12/31/2013 Elsevier Interactive Patient Education 2016 [employer] Inc[employer]  

## 2015-10-05 ENCOUNTER — Encounter: Payer: Self-pay | Admitting: Certified Nurse Midwife

## 2015-10-05 ENCOUNTER — Ambulatory Visit (INDEPENDENT_AMBULATORY_CARE_PROVIDER_SITE_OTHER): Payer: Medicaid Other | Admitting: Certified Nurse Midwife

## 2015-10-05 VITALS — BP 109/65 | HR 75 | Temp 98.3°F | Wt 221.5 lb

## 2015-10-05 DIAGNOSIS — O09523 Supervision of elderly multigravida, third trimester: Secondary | ICD-10-CM

## 2015-10-05 DIAGNOSIS — Z3483 Encounter for supervision of other normal pregnancy, third trimester: Secondary | ICD-10-CM

## 2015-10-05 LAB — OB RESULTS CONSOLE GBS: STREP GROUP B AG: NEGATIVE

## 2015-10-05 NOTE — Progress Notes (Signed)
Patient needs refill on vitamins.

## 2015-10-05 NOTE — Progress Notes (Signed)
Subjective:    Erin Bridges is a 40 y.o. female being seen today for her obstetrical visit. She is at 3368w3d gestation. Patient reports no complaints. Fetal movement: normal.  Problem List Items Addressed This Visit      Other   Encounter for supervision of other normal pregnancy in third trimester - Primary   Relevant Orders   Strep Gp B NAA   NuSwab VG+, Candida 6sp    Other Visit Diagnoses   None.    Patient Active Problem List   Diagnosis Date Noted  . Encounter for supervision of other normal pregnancy in third trimester 09/14/2015  . NSVD (normal spontaneous vaginal delivery) 02/10/2013  . PPH (postpartum hemorrhage) 02/10/2013  . Vaginal delivery 02/10/2013   Objective:    BP 109/65   Pulse 75   Temp 98.3 F (36.8 C)   Wt 221 lb 8 oz (100.5 kg)   LMP 01/22/2015 (Approximate)   BMI 31.78 kg/m  FHT:  150 BPM  Uterine Size: 36 cm and size equals dates  Presentation: cephalic   Cervix: 1cm, long, thick, posterior.  Ballotable.    NST: + accels, no decels, moderate variability, Cat. 1 tracing. 2 contractions on toco In 40 minutes.  Assessment:    Pregnancy @ 2268w3d weeks   AMA  Reactive NST  Occasional cxn  Plan:     labs reviewed, problem list updated Consent signed. GBS sent TDAP offered  Rhogam given for RH negative Pediatrician: discussed. Infant feeding: plans to breastfeed. Maternity leave: N/A. Cigarette smoking: never smoked. Orders Placed This Encounter  Procedures  . Strep Gp B NAA   No orders of the defined types were placed in this encounter.  Follow up in 1 Week with NST.

## 2015-10-07 LAB — STREP GP B NAA: STREP GROUP B AG: NEGATIVE

## 2015-10-12 ENCOUNTER — Encounter: Payer: Self-pay | Admitting: Obstetrics

## 2015-10-12 ENCOUNTER — Ambulatory Visit (INDEPENDENT_AMBULATORY_CARE_PROVIDER_SITE_OTHER): Payer: Medicaid Other | Admitting: Obstetrics

## 2015-10-12 VITALS — BP 122/80 | HR 86

## 2015-10-12 DIAGNOSIS — O09899 Supervision of other high risk pregnancies, unspecified trimester: Secondary | ICD-10-CM | POA: Diagnosis not present

## 2015-10-12 DIAGNOSIS — O09523 Supervision of elderly multigravida, third trimester: Secondary | ICD-10-CM | POA: Diagnosis not present

## 2015-10-12 DIAGNOSIS — Z3483 Encounter for supervision of other normal pregnancy, third trimester: Secondary | ICD-10-CM

## 2015-10-12 LAB — POCT URINALYSIS DIPSTICK
BILIRUBIN UA: NEGATIVE
Blood, UA: NEGATIVE
GLUCOSE UA: NEGATIVE
KETONES UA: NEGATIVE
Leukocytes, UA: NEGATIVE
Nitrite, UA: NEGATIVE
Protein, UA: NEGATIVE
SPEC GRAV UA: 1.015
Urobilinogen, UA: NEGATIVE
pH, UA: 5

## 2015-10-12 NOTE — Progress Notes (Signed)
No complaints

## 2015-10-12 NOTE — Progress Notes (Signed)
Subjective:    Erin LarocheJessica S Bridges is a 40 y.o. female being seen today for her obstetrical visit. She is at 4757w3d gestation. Patient reports no complaints. Fetal movement: normal.  Problem List Items Addressed This Visit    Encounter for supervision of other normal pregnancy in third trimester - Primary   Relevant Orders   POCT urinalysis dipstick (Completed)    Other Visit Diagnoses   None.    Patient Active Problem List   Diagnosis Date Noted  . Encounter for supervision of other normal pregnancy in third trimester 09/14/2015  . NSVD (normal spontaneous vaginal delivery) 02/10/2013  . PPH (postpartum hemorrhage) 02/10/2013  . Vaginal delivery 02/10/2013    Objective:    BP 122/80   Pulse 86   LMP 01/22/2015 (Approximate)  FHT: 150 BPM  Uterine Size: size equals dates  Presentations: unsure   NST:  Reactive  Assessment:    Pregnancy @ 3457w3d weeks   Plan:   Plans for delivery: VBAC planned; labs reviewed; problem list updated Counseling: Consent signed. Infant feeding: plans to breastfeed. Cigarette smoking: never smoked. L&D discussion: symptoms of labor, discussed when to call, discussed what number to call, anesthetic/analgesic options reviewed and delivering clinician:  plans Certified Nurse-Midwife. Postpartum supports and preparation: circumcision discussed and contraception plans discussed.  Follow up in 1 Week.    Patient ID: Erin Bridges, female   DOB: [birth year], 40 y.o.   MRN: [id number]

## 2015-10-14 LAB — NUSWAB VG+, CANDIDA 6SP
CANDIDA KRUSEI, NAA: NEGATIVE
CANDIDA TROPICALIS, NAA: NEGATIVE
CHLAMYDIA TRACHOMATIS, NAA: NEGATIVE
Candida albicans, NAA: NEGATIVE
Candida glabrata, NAA: NEGATIVE
Candida lusitaniae, NAA: NEGATIVE
Candida parapsilosis, NAA: NEGATIVE
NEISSERIA GONORRHOEAE, NAA: NEGATIVE
TRICH VAG BY NAA: NEGATIVE

## 2015-10-19 ENCOUNTER — Ambulatory Visit (INDEPENDENT_AMBULATORY_CARE_PROVIDER_SITE_OTHER): Payer: Medicaid Other | Admitting: Obstetrics and Gynecology

## 2015-10-19 VITALS — BP 134/72 | HR 82 | Wt 221.0 lb

## 2015-10-19 DIAGNOSIS — Z98891 History of uterine scar from previous surgery: Secondary | ICD-10-CM | POA: Insufficient documentation

## 2015-10-19 DIAGNOSIS — O09523 Supervision of elderly multigravida, third trimester: Secondary | ICD-10-CM

## 2015-10-19 DIAGNOSIS — Z3483 Encounter for supervision of other normal pregnancy, third trimester: Secondary | ICD-10-CM

## 2015-10-19 DIAGNOSIS — O09529 Supervision of elderly multigravida, unspecified trimester: Secondary | ICD-10-CM | POA: Insufficient documentation

## 2015-10-19 NOTE — Progress Notes (Signed)
Subjective:  Erin Bridges is a 40 y.o. [phone] at 5626w3d being seen today for ongoing prenatal care.  She is currently monitored for the following issues for this high risk pregnancy and has Encounter for supervision of other normal pregnancy in third trimester; AMA (advanced maternal age) multigravida 35+; and H/O cesarean section on her problem list.  Patient reports no complaints.  Contractions: Irregular. Vag. Bleeding: None.  Movement: Present. Denies leaking of fluid.   The following portions of the patient's history were reviewed and updated as appropriate: allergies, current medications, past family history, past medical history, past social history, past surgical history and problem list. Problem list updated.  Objective:   Vitals:   10/19/15 1051  BP: 134/72  Pulse: 82  Weight: 221 lb (100.2 kg)    Fetal Status:     Movement: Present     General:  Alert, oriented and cooperative. Patient is in no acute distress.  Skin: Skin is warm and dry. No rash noted.   Cardiovascular: Normal heart rate noted  Respiratory: Normal respiratory effort, no problems with respiration noted  Abdomen: Soft, gravid, appropriate for gestational age. Pain/Pressure: Present     Pelvic:  Cervical exam performed        Extremities: Normal range of motion.  Edema: Trace  Mental Status: Normal mood and affect. Normal behavior. Normal judgment and thought content.   Urinalysis: Urine Protein: Negative Urine Glucose: Negative  Assessment and Plan:  Pregnancy: G4P3003 at 1626w3d  1. Encounter for supervision of other normal pregnancy in third trimester Labor precautions  2. AMA (advanced maternal age) multigravida 35+, third trimester   3. H/O cesarean section   4. History of VBAC  Desires VBAC again, consent signed  Term labor symptoms and general obstetric precautions including but not limited to vaginal bleeding, contractions, leaking of fluid and fetal movement were reviewed in detail  with the patient. Please refer to After Visit Summary for other counseling recommendations.  No Follow-up on file.   Erin StaggersMichael L Soul Deveney, MD

## 2015-10-22 ENCOUNTER — Encounter: Payer: Self-pay | Admitting: *Deleted

## 2015-10-22 DIAGNOSIS — Z3483 Encounter for supervision of other normal pregnancy, third trimester: Secondary | ICD-10-CM

## 2015-10-22 NOTE — Progress Notes (Signed)
Updated Problem list - Abstracted prenatal labs - Already scanned

## 2015-10-25 ENCOUNTER — Encounter: Payer: Self-pay | Admitting: Obstetrics & Gynecology

## 2015-10-25 ENCOUNTER — Ambulatory Visit (INDEPENDENT_AMBULATORY_CARE_PROVIDER_SITE_OTHER): Payer: Medicaid Other | Admitting: Obstetrics & Gynecology

## 2015-10-25 VITALS — BP 115/69 | HR 71 | Temp 98.9°F | Wt 224.8 lb

## 2015-10-25 DIAGNOSIS — Z23 Encounter for immunization: Secondary | ICD-10-CM

## 2015-10-25 DIAGNOSIS — O34219 Maternal care for unspecified type scar from previous cesarean delivery: Secondary | ICD-10-CM

## 2015-10-25 DIAGNOSIS — Z3009 Encounter for other general counseling and advice on contraception: Secondary | ICD-10-CM | POA: Insufficient documentation

## 2015-10-25 DIAGNOSIS — O09523 Supervision of elderly multigravida, third trimester: Secondary | ICD-10-CM

## 2015-10-25 DIAGNOSIS — Z3490 Encounter for supervision of normal pregnancy, unspecified, unspecified trimester: Secondary | ICD-10-CM

## 2015-10-25 MED ORDER — TETANUS-DIPHTH-ACELL PERTUSSIS 5-2.5-18.5 LF-MCG/0.5 IM SUSP
0.5000 mL | Freq: Once | INTRAMUSCULAR | Status: AC
  Administered 2015-10-25: 0.5 mL via INTRAMUSCULAR

## 2015-10-25 NOTE — Progress Notes (Signed)
Patient states that she feels pressure that is not constant, with irregular contractions, not daily.

## 2015-10-25 NOTE — Patient Instructions (Signed)
Laparoscopic Tubal Ligation Laparoscopic tubal ligation is a procedure that closes the fallopian tubes at a time other than right after childbirth. When the fallopian tubes are closed, the eggs that are released from the ovaries cannot enter the uterus, and sperm cannot reach the egg. Tubal ligation is also known as getting your "tubes tied." Tubal ligation is done so you will not be able to get pregnant or have a baby. Although this procedure may be undone (reversed), it should be considered permanent and irreversible. If you want to have future pregnancies, you should not have this procedure. LET YOUR HEALTH CARE PROVIDER KNOW ABOUT:  Any allergies you have.  All medicines you are taking, including vitamins, herbs, eye drops, creams, and over-the-counter medicines. This includes any use of steroids, either by mouth or in cream form.  Previous problems you or members of your family have had with the use of anesthetics.  Any blood disorders you have.  Previous surgeries you have had.  Any medical conditions you may have.  Possibility of pregnancy, if this applies.  Any past pregnancies. RISKS AND COMPLICATIONS  Infection.  Bleeding.  Injury to surrounding organs.  Side effects from anesthetics.  Failure of the procedure.  Ectopic pregnancy.  Future regret about having the procedure done. BEFORE THE PROCEDURE  Ask your health care provider about:  Changing or stopping your regular medicines. This is especially important if you are taking diabetes medicines or blood thinners.  Taking medicines such as aspirin and ibuprofen. These medicines can thin your blood. Do not take these medicines before your procedure if your health care provider instructs you not to.  Follow instructions from your health care provider about eating and drinking restrictions.  Plan to have someone take you home after the procedure.  If you go home right after the procedure, plan to have someone  with you for 24 hours. PROCEDURE  You will be given one or more of the following:  A medicine that helps you relax (sedative).  A medicine that numbs the area (local anesthetic).  A medicine that makes you fall asleep (general anesthetic).  A medicine that is injected into an area of your body that numbs everything below the injection site (regional anesthetic).  If you have been given general anesthetic, a tube will be put down your throat to help you breathe.  Two small cuts (incisions) will be made in the lower abdominal area and near the belly button.  Your bladder may be emptied with a small tube (catheter).  Your abdomen will be inflated with a safe gas (carbon dioxide). This will help to give the surgeon room to operate and visualize, and it will help the surgeon to avoid other organs.  A thin, lighted tube (laparoscope) with a camera attached will be inserted into your abdomen through one of the incisions near the belly button. Other small instruments will be inserted through the other abdominal incision.  The fallopian tubes will be tied off or burned (cauterized), or they will be blocked with a clip, ring, or clamp. In many cases, a small portion in the center of each fallopian tube will also be removed.  After the fallopian tubes are blocked, the gas will be released from the abdomen.  The incisions will be closed with stitches (sutures).  A bandage (dressing) will be placed over the incisions. The procedure may vary among health care providers and hospitals. AFTER THE PROCEDURE  Your blood pressure, heart rate, breathing rate, and blood oxygen level   will be monitored often until the medicines you were given have worn off.  You will be given pain medicine as needed.  If you had general anesthetic, you may have some mild discomfort in your throat. This is from the breathing tube that was placed in your throat while you were sleeping.  You may experience discomfort in  the shoulder area from some trapped air between your liver and your diaphragm. This sensation is normal, and it will slowly go away on its own.  You will have some mild abdominal discomfort for 3--7 days.   This information is not intended to replace advice given to you by your health care provider. Make sure you discuss any questions you have with your health care provider.   Document Released: 05/01/2000 Document Revised: 06/09/2014 Document Reviewed: 05/06/2011 Elsevier Interactive Patient Education 2016 [employer] Inc.  

## 2015-10-25 NOTE — Progress Notes (Signed)
   PRENATAL VISIT NOTE  Subjective:  Erin Bridges is a 40 y.o. [phone] at 3576w2d being seen today for ongoing prenatal care.  She is currently monitored for the following issues for this high-risk pregnancy and has Supervision of normal pregnancy; AMA (advanced maternal age) multigravida 35+; Previous cesarean section complicating pregnancy, antepartum condition or complication; and Sterilization consult on her problem list.  Patient reports occasional contractions and lower ext edema.  Contractions: Irregular. Vag. Bleeding: None.  Movement: Present. Denies leaking of fluid.   The following portions of the patient's history were reviewed and updated as appropriate: allergies, current medications, past family history, past medical history, past social history, past surgical history and problem list. Problem list updated.  Objective:   Vitals:   10/25/15 1056  BP: 115/69  Pulse: 71  Temp: 98.9 F (37.2 C)  Weight: 224 lb 12.8 oz (102 kg)    Fetal Status:     Movement: Present     General:  Alert, oriented and cooperative. Patient is in no acute distress.  Skin: Skin is warm and dry. No rash noted.   Cardiovascular: Normal heart rate noted  Respiratory: Normal respiratory effort, no problems with respiration noted  Abdomen: Soft, gravid, appropriate for gestational age. Pain/Pressure: Present     Pelvic:  Cervical exam deferred        Extremities: Normal range of motion.  Edema: Trace  Mental Status: Normal mood and affect. Normal behavior. Normal judgment and thought content.   Urinalysis: Urine Protein: Negative Urine Glucose: Negative  Assessment and Plan:  Pregnancy: G4P3003 at 976w2d  1. Supervision of normal pregnancy, unspecified trimester  2. Previous cesarean section complicating pregnancy, antepartum condition or complication Desires TOLAC.  H/o 2 prior VBACs  3. AMA (advanced maternal age) multigravida 35+, third trimester  4. Sterilization consult Pt desires  bilateral salpingectomy Title XIX papers signed 10/25/2015  Term labor symptoms and general obstetric precautions including but not limited to vaginal bleeding, contractions, leaking of fluid and fetal movement were reviewed in detail with the patient. Please refer to After Visit Summary for other counseling recommendations.  No Follow-up on file.  Willodean Rosenthalarolyn Harraway-Smith, MD

## 2015-10-25 NOTE — Addendum Note (Signed)
Addended by: Elby BeckPAUL, Lexani Corona F on: 10/25/2015 12:12 PM   Modules accepted: Orders

## 2015-11-01 ENCOUNTER — Ambulatory Visit (INDEPENDENT_AMBULATORY_CARE_PROVIDER_SITE_OTHER): Payer: Medicaid Other | Admitting: Obstetrics

## 2015-11-01 VITALS — BP 126/79 | HR 69 | Temp 97.9°F | Wt 225.0 lb

## 2015-11-01 DIAGNOSIS — O09523 Supervision of elderly multigravida, third trimester: Secondary | ICD-10-CM

## 2015-11-01 DIAGNOSIS — O34219 Maternal care for unspecified type scar from previous cesarean delivery: Secondary | ICD-10-CM | POA: Diagnosis not present

## 2015-11-01 DIAGNOSIS — Z3493 Encounter for supervision of normal pregnancy, unspecified, third trimester: Secondary | ICD-10-CM | POA: Diagnosis not present

## 2015-11-01 DIAGNOSIS — Z3490 Encounter for supervision of normal pregnancy, unspecified, unspecified trimester: Secondary | ICD-10-CM

## 2015-11-01 NOTE — Progress Notes (Signed)
Patient reports contractions- but nothing that is labor

## 2015-11-02 ENCOUNTER — Encounter: Payer: Self-pay | Admitting: Obstetrics

## 2015-11-02 NOTE — Progress Notes (Signed)
Subjective:    Gilmore LarocheJessica S Dodds is a 40 y.o. female being seen today for her obstetrical visit. She is at 224w3d gestation. Patient reports no complaints. Fetal movement: normal.  Problem List Items Addressed This Visit    AMA (advanced maternal age) multigravida 35+   Previous cesarean section complicating pregnancy, antepartum condition or complication   Supervision of normal pregnancy    Other Visit Diagnoses   None.    Patient Active Problem List   Diagnosis Date Noted  . Previous cesarean section complicating pregnancy, antepartum condition or complication 10/25/2015  . Sterilization consult 10/25/2015  . AMA (advanced maternal age) multigravida 35+ 10/19/2015  . Supervision of normal pregnancy 09/14/2015    Objective:    BP 126/79   Pulse 69   Temp 97.9 F (36.6 C)   Wt 225 lb (102.1 kg)   LMP 01/22/2015 (Approximate)   BMI 32.28 kg/m  FHT:  150 BPM  Uterine Size: size equals dates  Presentation: cephalic  Pelvic Exam:              Dilation: 2cm       Effacement: 50%   Station:  -3     Consistency: medium            Position: posterior     NST:  Reactive, occasional UC  Assessment:    Pregnancy @ [redacted]w[redacted]d  weeks   Plan:    Postdates management: discussed fetal surveillance and induction, discussed fetal movement, NST reactive, biophysical profile ordered. Induction: scheduled for Saturday, written information given.  Follow up in postpartum period.  Patient ID: Gilmore LarocheJessica S Almeda, female   DOB: [birth year], 40 y.o.   MRN: [id number]

## 2015-11-04 ENCOUNTER — Telehealth (HOSPITAL_COMMUNITY): Payer: Self-pay | Admitting: *Deleted

## 2015-11-04 ENCOUNTER — Encounter (HOSPITAL_COMMUNITY): Payer: Self-pay | Admitting: *Deleted

## 2015-11-04 NOTE — Telephone Encounter (Signed)
Preadmission screen  

## 2015-11-06 ENCOUNTER — Encounter (HOSPITAL_COMMUNITY): Payer: Self-pay

## 2015-11-06 ENCOUNTER — Inpatient Hospital Stay (HOSPITAL_COMMUNITY): Payer: Medicaid Other | Admitting: Anesthesiology

## 2015-11-06 ENCOUNTER — Other Ambulatory Visit: Payer: Self-pay | Admitting: Advanced Practice Midwife

## 2015-11-06 ENCOUNTER — Inpatient Hospital Stay (HOSPITAL_COMMUNITY)
Admission: RE | Admit: 2015-11-06 | Discharge: 2015-11-08 | DRG: 775 | Disposition: A | Discharge status: Home / Self Care | Payer: Medicaid Other | Source: Ambulatory Visit | Attending: Family Medicine | Admitting: Family Medicine

## 2015-11-06 DIAGNOSIS — Z3A41 41 weeks gestation of pregnancy: Secondary | ICD-10-CM | POA: Diagnosis not present

## 2015-11-06 DIAGNOSIS — O34211 Maternal care for low transverse scar from previous cesarean delivery: Secondary | ICD-10-CM | POA: Diagnosis present

## 2015-11-06 DIAGNOSIS — Z8249 Family history of ischemic heart disease and other diseases of the circulatory system: Secondary | ICD-10-CM | POA: Diagnosis not present

## 2015-11-06 DIAGNOSIS — O480 Post-term pregnancy: Secondary | ICD-10-CM | POA: Diagnosis present

## 2015-11-06 LAB — CBC
HCT: 39.7 % (ref 36.0–46.0)
HEMOGLOBIN: 13.9 g/dL (ref 12.0–15.0)
MCH: 30.7 pg (ref 26.0–34.0)
MCHC: 35 g/dL (ref 30.0–36.0)
MCV: 87.6 fL (ref 78.0–100.0)
PLATELETS: 271 10*3/uL (ref 150–400)
RBC: 4.53 MIL/uL (ref 3.87–5.11)
RDW: 12.8 % (ref 11.5–15.5)
WBC: 7.8 10*3/uL (ref 4.0–10.5)

## 2015-11-06 LAB — PREPARE RBC (CROSSMATCH)

## 2015-11-06 MED ORDER — SOD CITRATE-CITRIC ACID 500-334 MG/5ML PO SOLN: 30.0000 mL | ORAL | Status: DC | PRN

## 2015-11-06 MED ORDER — PHENYLEPHRINE 40 MCG/ML (10ML) SYRINGE FOR IV PUSH (FOR BLOOD PRESSURE SUPPORT)
80.0000 ug | PREFILLED_SYRINGE | INTRAVENOUS | Status: DC | PRN
  Filled 2015-11-06: qty 5

## 2015-11-06 MED ORDER — IBUPROFEN 600 MG PO TABS
600.0000 mg | ORAL_TABLET | Freq: Four times a day (QID) | ORAL | Status: DC
  Administered 2015-11-07 – 2015-11-08 (×7): 600 mg via ORAL
  Filled 2015-11-06 (×7): qty 1

## 2015-11-06 MED ORDER — LACTATED RINGERS IV SOLN
500.0000 mL | INTRAVENOUS | Status: DC | PRN
  Administered 2015-11-06: 500 mL via INTRAVENOUS

## 2015-11-06 MED ORDER — EPHEDRINE 5 MG/ML INJ
10.0000 mg | INTRAVENOUS | Status: DC | PRN
  Filled 2015-11-06: qty 4

## 2015-11-06 MED ORDER — PHENYLEPHRINE 40 MCG/ML (10ML) SYRINGE FOR IV PUSH (FOR BLOOD PRESSURE SUPPORT)
PREFILLED_SYRINGE | INTRAVENOUS | Status: AC
  Administered 2015-11-06: 80 ug via INTRAVENOUS
  Filled 2015-11-06: qty 20

## 2015-11-06 MED ORDER — TERBUTALINE SULFATE 1 MG/ML IJ SOLN
0.2500 mg | Freq: Once | INTRAMUSCULAR | Status: DC | PRN
  Filled 2015-11-06: qty 1

## 2015-11-06 MED ORDER — OXYTOCIN BOLUS FROM INFUSION
500.0000 mL | Freq: Once | INTRAVENOUS | Status: AC
  Administered 2015-11-06: 500 mL via INTRAVENOUS

## 2015-11-06 MED ORDER — LIDOCAINE HCL (PF) 1 % IJ SOLN
30.0000 mL | INTRAMUSCULAR | Status: DC | PRN
  Administered 2015-11-06: 30 mL via SUBCUTANEOUS
  Filled 2015-11-06: qty 30

## 2015-11-06 MED ORDER — PHENYLEPHRINE 40 MCG/ML (10ML) SYRINGE FOR IV PUSH (FOR BLOOD PRESSURE SUPPORT)
80.0000 ug | PREFILLED_SYRINGE | INTRAVENOUS | Status: DC | PRN
  Administered 2015-11-06: 80 ug via INTRAVENOUS
  Filled 2015-11-06: qty 5

## 2015-11-06 MED ORDER — SODIUM CHLORIDE 0.9 % IV SOLN: Freq: Once | INTRAVENOUS | Status: DC

## 2015-11-06 MED ORDER — FENTANYL 2.5 MCG/ML BUPIVACAINE 1/10 % EPIDURAL INFUSION (WH - ANES)
14.0000 mL/h | INTRAMUSCULAR | Status: DC | PRN
  Administered 2015-11-06: 17 mL/h via EPIDURAL

## 2015-11-06 MED ORDER — LACTATED RINGERS IV SOLN
500.0000 mL | Freq: Once | INTRAVENOUS | Status: AC
  Administered 2015-11-06: 500 mL via INTRAVENOUS

## 2015-11-06 MED ORDER — LIDOCAINE HCL (PF) 1 % IJ SOLN
INTRAMUSCULAR | Status: DC | PRN
  Administered 2015-11-06: 4 mL
  Administered 2015-11-06: 6 mL via EPIDURAL

## 2015-11-06 MED ORDER — SODIUM CHLORIDE 0.9 % IV SOLN: Freq: Once | INTRAVENOUS | Status: DC | PRN

## 2015-11-06 MED ORDER — FENTANYL CITRATE (PF) 100 MCG/2ML IJ SOLN: 100.0000 ug | INTRAMUSCULAR | Status: DC | PRN

## 2015-11-06 MED ORDER — LACTATED RINGERS IV SOLN
INTRAVENOUS | Status: DC
  Administered 2015-11-06 (×3): via INTRAVENOUS

## 2015-11-06 MED ORDER — MISOPROSTOL 200 MCG PO TABS
ORAL_TABLET | ORAL | Status: AC
  Filled 2015-11-06: qty 5

## 2015-11-06 MED ORDER — OXYCODONE-ACETAMINOPHEN 5-325 MG PO TABS: 1.0000 | ORAL_TABLET | ORAL | Status: DC | PRN

## 2015-11-06 MED ORDER — OXYTOCIN 40 UNITS IN LACTATED RINGERS INFUSION - SIMPLE MED
1.0000 m[IU]/min | INTRAVENOUS | Status: DC
  Administered 2015-11-06: 1 m[IU]/min via INTRAVENOUS

## 2015-11-06 MED ORDER — FENTANYL 2.5 MCG/ML BUPIVACAINE 1/10 % EPIDURAL INFUSION (WH - ANES)
INTRAMUSCULAR | Status: AC
  Administered 2015-11-06: 17 mL/h via EPIDURAL
  Filled 2015-11-06: qty 125

## 2015-11-06 MED ORDER — DIPHENHYDRAMINE HCL 50 MG/ML IJ SOLN: 12.5000 mg | INTRAMUSCULAR | Status: DC | PRN

## 2015-11-06 MED ORDER — ONDANSETRON HCL 4 MG/2ML IJ SOLN: 4.0000 mg | Freq: Four times a day (QID) | INTRAMUSCULAR | Status: DC | PRN

## 2015-11-06 MED ORDER — OXYTOCIN 40 UNITS IN LACTATED RINGERS INFUSION - SIMPLE MED
2.5000 [IU]/h | INTRAVENOUS | Status: DC
Start: 2015-11-06 — End: 2015-11-07
  Filled 2015-11-06: qty 1000

## 2015-11-06 MED ORDER — OXYCODONE-ACETAMINOPHEN 5-325 MG PO TABS: 2.0000 | ORAL_TABLET | ORAL | Status: DC | PRN

## 2015-11-06 MED ORDER — ACETAMINOPHEN 325 MG PO TABS: 650.0000 mg | ORAL_TABLET | ORAL | Status: DC | PRN

## 2015-11-06 NOTE — Progress Notes (Signed)
Pt provided with electric breast pump and instructed on nipple stimulation instructions given by CNM

## 2015-11-06 NOTE — Progress Notes (Signed)
Patient ID: Erin Bridges, female   DOB: 1975-07-17, 40 y.o.   MRN: [id number]  Erin Bridges is a 40 y.o. G5P3003 at 3074w0d admitted for induction of labor due to Post dates. Due date 10/30/15. TOLAC  Subjective: Got tired of nipple stim earlier and agreed to pitocin, low-dose only  Objective: BP 123/63   Pulse 64   Temp 98.7 F (37.1 C)   Resp 16   Ht 5' 10.5" (1.791 m)   Wt 101.2 kg (223 lb)   LMP 12/27/2014   BMI 31.54 kg/m  No intake/output data recorded.  FHT:  FHR: 145 bpm, variability: moderate,  accelerations:  Present,  decelerations:  Absent UC:   irregular  SVE:   Dilation: 4 Effacement (%): 70 Station: -2 Exam by:: J.Thornton, RN   Pitocin @ 3 mu/min  Labs: Lab Results  Component Value Date   WBC 7.8 11/06/2015   HGB 13.9 11/06/2015   HCT 39.7 11/06/2015   MCV 87.6 11/06/2015   PLT 271 11/06/2015    Assessment / Plan: IOL d/t postdates, was tired of nipple stim, agreed to low-dose pitocin, continue to increase per protocol to acheive adequate uc's/labor  Labor: not yet Fetal Wellbeing:  Category I Pain Control:  n/a Pre-eclampsia: n/a I/D:  n/a Anticipated MOD:  VBAC  Marge DuncansBooker, Kimberly Randall CNM, WHNP-BC 11/06/2015, 3:03 PM

## 2015-11-06 NOTE — Anesthesia Preprocedure Evaluation (Signed)
Anesthesia Evaluation  Patient identified by MRN, date of birth, ID band Patient awake    Reviewed: Allergy & Precautions, H&P , Patient's Chart, lab work & pertinent test results  Airway Mallampati: II  TM Distance: >3 FB Neck ROM: full    Dental  (+) Teeth Intact   Pulmonary    breath sounds clear to auscultation       Cardiovascular  Rhythm:regular Rate:Normal     Neuro/Psych    GI/Hepatic   Endo/Other    Renal/GU      Musculoskeletal   Abdominal   Peds  Hematology   Anesthesia Other Findings  Post Partum Hemorrhage history     Reproductive/Obstetrics (+) Pregnancy                             Anesthesia Physical Anesthesia Plan  ASA: II  Anesthesia Plan: Epidural   Post-op Pain Management:    Induction:   Airway Management Planned:   Additional Equipment:   Intra-op Plan:   Post-operative Plan:   Informed Consent: I have reviewed the patients History and Physical, chart, labs and discussed the procedure including the risks, benefits and alternatives for the proposed anesthesia with the patient or authorized representative who has indicated his/her understanding and acceptance.   Dental Advisory Given  Plan Discussed with:   Anesthesia Plan Comments: (Labs checked- platelets confirmed with RN in room. Fetal heart tracing, per RN, reported to be stable enough for sitting procedure. Discussed epidural, and patient consents to the procedure:  included risk of possible headache,backache, failed block, allergic reaction, and nerve injury. This patient was asked if she had any questions or concerns before the procedure started.)        Anesthesia Quick Evaluation

## 2015-11-06 NOTE — H&P (Signed)
Erin Bridges is a 40 y.o. 555P3003 female at 6842w0d by early u/s (not able to view) presenting for IOL d/t postdates.  Unable to access u/s to verify dating. Reports active fetal movement, contractions: none, vaginal bleeding: none, membranes: intact. [facility] 33 wks.   Most recent u/s 33wk new ob/anatomy u/s- limited views.  Wants natural birth, does not want pitocin, requested intermittent monitoring- but was notified this was not an option w/ VBAC. Had called earlier to see if she could come in sometime next week for IOL- attending on-call was not comfortable w/ this, so pt came in today as scheduled. Desires VBAC, consent previously signed.   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 Medical history non-contributory     Past Surgical History: Past Surgical History:  Procedure Laterality Date  . CESAREAN SECTION    . TONSILLECTOMY    . WISDOM TOOTH EXTRACTION      Obstetrical History: OB History    Gravida Para Term Preterm AB Living   5 3 3     3    SAB TAB Ectopic Multiple Live Births           3      Social History: Social History   Social History  . Marital status: Married    Spouse name: N/A  . Number of children: N/A  . Years of education: N/A   Social History Main Topics  . Smoking status: Never Smoker  . Smokeless tobacco: Never Used  . Alcohol use No  . Drug use: No  . Sexual activity: Yes   Other Topics Concern  . None   Social History Narrative  . None    Family History: Family History  Problem Relation Age of Onset  . Cancer Mother   . Hypertension Mother   . Diverticulitis Father   . Tuberculosis Maternal Grandmother   . Heart disease Maternal Grandfather     Allergies: No Known Allergies  Prescriptions Prior to Admission  Medication Sig Dispense Refill Last Dose  . calcium  carbonate (TUMS - DOSED IN MG ELEMENTAL CALCIUM) 500 MG chewable tablet Chew 2 tablets by mouth daily as needed for indigestion or heartburn.   11/05/2015 at Unknown time  . Prenatal Vit-Fe Fumarate-FA (PRENATAL MULTIVITAMIN) TABS tablet Take 1 tablet by mouth daily at 12 noon.    11/05/2015 at Unknown time    Review of Systems  Pertinent pos/neg as indicated in HPI  Blood pressure 123/70, pulse 71, temperature 97.8 F (36.6 C), temperature source Oral, height 5' 10.5" (1.791 m), weight 101.2 kg (223 lb), last menstrual period 12/27/2014, unknown if currently breastfeeding. General appearance: alert, cooperative and no distress Lungs: clear to auscultation bilaterally Heart: regular rate and rhythm Abdomen: gravid, soft, non-tender Extremities: NO edema DTR's 2+  Fetal monitoring: FHR: 140 bpm, variability: moderate,  Accelerations: Present,  decelerations:  Absent Uterine activity: irregular Dilation: 3 Effacement (%): 70 Station: -2 Exam by:: K.Suzanne Kho, CNM Presentation: cephalic   Prenatal labs: ABO, Rh: --/--/A POS (09/30 0845) Antibody: NEG (09/30 0845) Rubella: !Error! RPR: Nonreactive (07/06 0000)  HBsAg: Negative (07/06 0000)  HIV: Non-reactive (02/15 0000)  GBS: Negative (08/29 1447)   1 hr Glucola: 92 Genetic screening:  declined Anatomy US: normal, limited views  Results for orders placed or performed during the hospital encounter of 11/06/15 (  from the past 24 hour(s))  CBC   Collection Time: 11/06/15  8:45 AM  Result Value Ref Range   WBC 7.8 4.0 - 10.5 K/uL   RBC 4.53 3.87 - 5.11 MIL/uL   Hemoglobin 13.9 12.0 - 15.0 g/dL   HCT 16.1 09.6 - 04.5 %   MCV 87.6 78.0 - 100.0 fL   MCH 30.7 26.0 - 34.0 pg   MCHC 35.0 30.0 - 36.0 g/dL   RDW 40.9 81.1 - 91.4 %   Platelets 271 150 - 400 K/uL  Type and screen   Collection Time: 11/06/15  8:45 AM  Result Value Ref Range   ABO/RH(D) A POS    Antibody Screen NEG    Sample Expiration 11/09/2015       Assessment:  [redacted]w[redacted]d SIUP  G5P3003  IOL for postdates  H/O c/s  Prev VBAC x 2  H/O PPH  Late pnc  Cat 1 FHR  GBS Negative (08/29 1447)  Plan:  Admit to BS  IV pain meds/epidural prn active labor  Pt prefers nipple stimulation to induce labor- ordered electric pump- pump only 1 breast x , then wait and repeat- stop when uc's q  Also discussed option of arom/pit- if nipple stim not effective, prefers pit over arom  Discussed risks/benefits of VBAC including uterine rupture, pt understands risks, consent previously signed  Crossmatch 2u PRBC d/t h/o PPH  Anticipate VBAC   Plans to breastfeed  Contraception: interval salpingectomy  Circumcision: n/a  Marge Duncans CNM, WHNP-BC 11/06/2015, 9:47 AM

## 2015-11-06 NOTE — Anesthesia Procedure Notes (Signed)

## 2015-11-06 NOTE — Progress Notes (Signed)
Patient ID: Erin Bridges, female   DOB: 02-11-1975, 40 y.o.   MRN: [id number] Erin Bridges is a 40 y.o. G5P3003 at 5139w0d admitted for induction of labor due to Post dates. Due date 10/30/15. TOLAC  Subjective: Doing well, uc's getting more uncomfortable, would like water broken  Objective: BP 127/71   Pulse 74   Temp 98.7 F (37.1 C)   Resp 18   Ht 5' 10.5" (1.791 m)   Wt 101.2 kg (223 lb)   LMP 12/27/2014   BMI 31.54 kg/m  No intake/output data recorded.  FHT:  FHR: 150 bpm, variability: min-mod,  accelerations:  Present,  decelerations:  Absent UC:   q 2-393mins  SVE:   Dilation: 4 Effacement (%): 70 Station: -2 Exam by:: K.Rolando Hessling, CNM  AROM mod amt clear fluid  Pitocin @ 8 mu/min  Labs: Lab Results  Component Value Date   WBC 7.8 11/06/2015   HGB 13.9 11/06/2015   HCT 39.7 11/06/2015   MCV 87.6 11/06/2015   PLT 271 11/06/2015    Assessment / Plan: IOL d/t postdates, now arom'd, increase pitocin per protocol if needed to obtain adequate uc's/labor  Labor: early Fetal Wellbeing:  Category I Pain Control:  Labor support without medications Pre-eclampsia: n/a I/D:  n/a Anticipated MOD:  VBAC  Erin DuncansBooker, Horton Ellithorpe Randall CNM, WHNP-BC 11/06/2015, 6:35 PM

## 2015-11-07 ENCOUNTER — Encounter (HOSPITAL_COMMUNITY): Payer: Self-pay

## 2015-11-07 LAB — RPR: RPR Ser Ql: NONREACTIVE

## 2015-11-07 MED ORDER — MEASLES, MUMPS & RUBELLA VAC ~~LOC~~ INJ
0.5000 mL | INJECTION | Freq: Once | SUBCUTANEOUS | Status: DC
  Filled 2015-11-07: qty 0.5

## 2015-11-07 MED ORDER — SENNOSIDES-DOCUSATE SODIUM 8.6-50 MG PO TABS: 2.0000 | ORAL_TABLET | ORAL | Status: DC

## 2015-11-07 MED ORDER — ZOLPIDEM TARTRATE 5 MG PO TABS: 5.0000 mg | ORAL_TABLET | Freq: Every evening | ORAL | Status: DC | PRN

## 2015-11-07 MED ORDER — DIPHENHYDRAMINE HCL 25 MG PO CAPS: 25.0000 mg | ORAL_CAPSULE | Freq: Four times a day (QID) | ORAL | Status: DC | PRN

## 2015-11-07 MED ORDER — PRENATAL MULTIVITAMIN CH
1.0000 | ORAL_TABLET | Freq: Every day | ORAL | Status: DC
  Administered 2015-11-07 – 2015-11-08 (×2): 1 via ORAL
  Filled 2015-11-07 (×2): qty 1

## 2015-11-07 MED ORDER — TETANUS-DIPHTH-ACELL PERTUSSIS 5-2.5-18.5 LF-MCG/0.5 IM SUSP: 0.5000 mL | Freq: Once | INTRAMUSCULAR | Status: DC

## 2015-11-07 MED ORDER — ONDANSETRON HCL 4 MG/2ML IJ SOLN: 4.0000 mg | INTRAMUSCULAR | Status: DC | PRN

## 2015-11-07 MED ORDER — BENZOCAINE-MENTHOL 20-0.5 % EX AERO
1.0000 "application " | INHALATION_SPRAY | CUTANEOUS | Status: DC | PRN
  Filled 2015-11-07: qty 56

## 2015-11-07 MED ORDER — OXYCODONE HCL 5 MG PO TABS: 10.0000 mg | ORAL_TABLET | ORAL | Status: DC | PRN

## 2015-11-07 MED ORDER — COCONUT OIL OIL
1.0000 "application " | TOPICAL_OIL | Status: DC | PRN
  Administered 2015-11-08: 1 via TOPICAL
  Filled 2015-11-07: qty 120

## 2015-11-07 MED ORDER — OXYCODONE HCL 5 MG PO TABS: 5.0000 mg | ORAL_TABLET | ORAL | Status: DC | PRN

## 2015-11-07 MED ORDER — ONDANSETRON HCL 4 MG PO TABS: 4.0000 mg | ORAL_TABLET | ORAL | Status: DC | PRN

## 2015-11-07 MED ORDER — ACETAMINOPHEN 325 MG PO TABS
650.0000 mg | ORAL_TABLET | ORAL | Status: DC | PRN
  Administered 2015-11-07 (×2): 650 mg via ORAL
  Filled 2015-11-07 (×2): qty 2

## 2015-11-07 MED ORDER — SIMETHICONE 80 MG PO CHEW: 80.0000 mg | CHEWABLE_TABLET | ORAL | Status: DC | PRN

## 2015-11-07 MED ORDER — WITCH HAZEL-GLYCERIN EX PADS: 1.0000 "application " | MEDICATED_PAD | CUTANEOUS | Status: DC | PRN

## 2015-11-07 MED ORDER — DIBUCAINE 1 % RE OINT: 1.0000 "application " | TOPICAL_OINTMENT | RECTAL | Status: DC | PRN

## 2015-11-07 NOTE — Anesthesia Postprocedure Evaluation (Signed)
Anesthesia Post Note  Patient: Erin Bridges  Procedure(s) Performed: * No procedures listed *  Patient location during evaluation: Mother Baby Anesthesia Type: Epidural Level of consciousness: awake Pain management: satisfactory to patient Vital Signs Assessment: post-procedure vital signs reviewed and stable Respiratory status: spontaneous breathing Cardiovascular status: stable Anesthetic complications: no     Last Vitals:  Vitals:   11/07/15 0145 11/07/15 0550  BP: 121/61 107/63  Pulse: 73 (!) 57  Resp: 20 18  Temp: 36.7 C 36.4 C    Last Pain:  Vitals:   11/07/15 0550  TempSrc: Oral  PainSc:    Pain Goal:                 KeyCorpBURGER,Alexsia Klindt

## 2015-11-07 NOTE — Progress Notes (Signed)
Post Partum Day 1 Subjective: Eating, drinking, voiding, ambulating well.  +flatus.  Lochia and pain wnl.  Denies dizziness, lightheadedness, or sob. No complaints.   Objective: Blood pressure 107/63, pulse (!) 57, temperature 97.5 F (36.4 C), temperature source Oral, resp. rate 18, height 5' 10.5" (1.791 m), weight 101.2 kg (223 lb), last menstrual period 01/22/2015, SpO2 100 %, unknown if currently breastfeeding.  Physical Exam:  General: alert, cooperative and no distress Lochia: appropriate Uterine Fundus: firm Incision: n/a DVT Evaluation: No evidence of DVT seen on physical exam. Negative Homan's sign. No cords or calf tenderness. No significant calf/ankle edema.   Recent Labs  11/06/15 0845  HGB 13.9  HCT 39.7    Assessment/Plan: Plan for discharge tomorrow, Breastfeeding and Contraception interval salpingectomy   LOS: 1 day   Marge DuncansBooker, Kimberly Randall 11/07/2015, 9:30 AM

## 2015-11-07 NOTE — Lactation Note (Addendum)
This note was copied from a baby's chart. Lactation Consultation Note  Experienced BF mom reports this baby has been nursing well. Latching with no pain.:S 9 by RN.  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 Did not have revision done and baby went on to nurse for 1 year. Discussed feeding cues and cluster feeding .Baby asleep in bassinet at this time. Asking about lanolin- encouraged to rub EBM into nipples after nursing, BF brochure given- reviewed our phone number, OP appointments and BFSG as resources for support after DC. No questions at present.   Patient Name: [name] ZOXWR'UToday's Date: 11/07/2015 Reason for consult: Initial assessment   Maternal Data Formula Feeding for Exclusion: No Has patient been taught Hand Expression?: Yes Does the patient have breastfeeding experience prior to this delivery?: Yes  Feeding Length of feed: 15 min  LATCH Score/Interventions                      Lactation Tools Discussed/Used     Consult Status Consult Status: PRN    Pamelia HoitWeeks, Danzig Macgregor D 11/07/2015, 12:17 PM

## 2015-11-07 NOTE — Progress Notes (Signed)
Post Partum Day 1 Subjective: no complaints, up ad lib, voiding and tolerating PO  Objective: Blood pressure 107/63, pulse (!) 57, temperature 97.5 F (36.4 C), temperature source Oral, resp. rate 18, height 5' 10.5" (1.791 m), weight 223 lb (101.2 kg), last menstrual period 01/22/2015, SpO2 100 %, unknown if currently breastfeeding.  Physical Exam:  General: alert, cooperative, appears stated age and no distress Lochia: appropriate Uterine Fundus: firm Incision: n/a DVT Evaluation: No evidence of DVT seen on physical exam.   Recent Labs  11/06/15 0845  HGB 13.9  HCT 39.7    Assessment/Plan: Plan for discharge tomorrow. Pt undecided on contraceptives. [facility]   LOS: 1 day   Erin Bridges 11/07/2015, 10:30 AM

## 2015-11-08 MED ORDER — IBUPROFEN 600 MG PO TABS: 600.0000 mg | ORAL_TABLET | Freq: Four times a day (QID) | ORAL | 0 refills | Status: AC

## 2015-11-08 NOTE — Lactation Note (Signed)
This note was copied from a baby's chart. Lactation Consultation Note  P4, Baby 36 hours old and cueing.  Parents state baby has been cluster feeding. Mother latched baby in cradle.  Demonstrated how to latch in cross cradle for increased depth. Encouraged her to breastfeed on both breasts per feeding.  Mother easily ebm. She states her nipples are tender.  No trauma observed.  Encouraged applying ebm and coconut oil if desired. Rhythmical swallows observed.  Suggest mother compress breast during feeding to keep her active. Mom encouraged to feed baby 8-12 times/24 hours and with feeding cues.  Reviewed engorgement care and monitoring voids/stools. Reviewed OP support services.  Patient Name: Erin Bridges [id number]'WToday's Date: 11/08/2015 Reason for consult: Follow-up assessment   Maternal Data    Feeding Feeding Type: Breast Fed Length of feed: 30 min  LATCH Score/Interventions Latch: Grasps breast easily, tongue down, lips flanged, rhythmical sucking.  Audible Swallowing: Spontaneous and intermittent Intervention(s): Skin to skin  Type of Nipple: Everted at rest and after stimulation  Comfort (Breast/Nipple): Filling, red/small blisters or bruises, mild/mod discomfort  Problem noted: Mild/Moderate discomfort (redness)  Hold (Positioning): No assistance needed to correctly position infant at breast.  LATCH Score: 9  Lactation Tools Discussed/Used     Consult Status Consult Status: Complete    Hardie PulleyBerkelhammer, Ruth Boschen 11/08/2015, 10:16 AM

## 2015-11-08 NOTE — Discharge Summary (Signed)
OB Discharge Summary  Patient Name: Erin Bridges DOB: [birth year] MRN: [id number]  Date of admission: 11/06/2015 Delivering MD: Reva Bores   Date of discharge: 11/08/2015  Admitting diagnosis: induction Intrauterine pregnancy: [redacted]w[redacted]d     Secondary diagnosis:Active Problems:   Post term pregnancy, 41 weeks  Additional problems:none     Discharge diagnosis: Term Pregnancy Delivered                                                                     Post partum procedures:none  Augmentation: Pitocin  Complications: None  Hospital course:  Induction of Labor With Vaginal Delivery   40 y.o. yo [id number] at [redacted]w[redacted]d was admitted to the hospital 11/06/2015 for induction of labor.  Indication for induction: Postdates.  Patient had an uncomplicated labor course as follows: Membrane Rupture Time/Date: 6:32 PM ,11/06/2015   Intrapartum Procedures: Episiotomy: None [1]                                         Lacerations:     Patient had delivery of a Viable infant.  Information for the patient's newborn:  Denine, Brotz [[id number]]  Delivery Method: VBAC, Spontaneous (Filed from Delivery Summary)   11/06/2015  Details of delivery can be found in separate delivery note.  Patient had a routine postpartum course. Patient is discharged home 11/08/15.   Physical exam Vitals:   11/07/15 0550 11/07/15 1400 11/07/15 1900 11/08/15 0556  BP: 107/63 (!) 121/45 117/66 (!) 102/58  Pulse: (!) 57 (!) 56 75 (!) 57  Resp: 18 18 20 16   Temp: 97.5 F (36.4 C) 98.6 F (37 C) 97.7 F (36.5 C) 97.7 F (36.5 C)  TempSrc: Oral Oral Oral Oral  SpO2:  99% 98%   Weight:      Height:       General: alert, cooperative and no distress Lochia: appropriate Uterine Fundus: firm Incision: N/A DVT Evaluation: No evidence of DVT seen on physical exam. Labs: Lab Results  Component Value Date   WBC 7.8 11/06/2015   HGB 13.9 11/06/2015   HCT 39.7 11/06/2015   MCV 87.6 11/06/2015   PLT 271  11/06/2015   No flowsheet data found.  Discharge instruction: per After Visit Summary and "Baby and Me Booklet".  After Visit Meds:    Medication List    TAKE these medications   calcium carbonate 500 MG chewable tablet Commonly known as:  TUMS - dosed in mg elemental calcium Chew 2 tablets by mouth daily as needed for indigestion or heartburn.   ibuprofen 600 MG tablet Commonly known as:  ADVIL,MOTRIN Take 1 tablet (600 mg total) by mouth every 6 (six) hours.   prenatal multivitamin Tabs tablet Take 1 tablet by mouth daily at 12 noon.       Diet: routine diet  Activity: Advance as tolerated. Pelvic rest for 6 weeks.   Outpatient follow up:6 weeks Follow up Appt:No future appointments. Follow up visit: No Follow-up on file.  Postpartum contraception: Undecided  Newborn Data: Live born female  Birth Weight: 7 lb 12.5 oz (3530 g) APGAR: 8, 9  Baby Feeding:  Breast Disposition:home with mother   11/08/2015 Wyvonnia DuskyMarie Lawson, CNM

## 2015-11-10 ENCOUNTER — Other Ambulatory Visit: Payer: Self-pay | Admitting: Certified Nurse Midwife

## 2015-11-10 LAB — TYPE AND SCREEN
ABO/RH(D): A POS
Antibody Screen: NEGATIVE
UNIT DIVISION: 0
Unit division: 0

## 2015-12-17 ENCOUNTER — Ambulatory Visit (INDEPENDENT_AMBULATORY_CARE_PROVIDER_SITE_OTHER): Payer: Medicaid Other | Admitting: Certified Nurse Midwife

## 2015-12-17 ENCOUNTER — Encounter: Payer: Self-pay | Admitting: Certified Nurse Midwife

## 2015-12-17 DIAGNOSIS — Z3009 Encounter for other general counseling and advice on contraception: Secondary | ICD-10-CM | POA: Diagnosis not present

## 2015-12-17 NOTE — Progress Notes (Signed)
Post Partum Exam  Erin LarocheJessica S Bridges is a 40 y.o. 155P4004 female who presents for a postpartum visit. She is 6 week postpartum following a spontaneous vaginal delivery. I have fully reviewed the prenatal and intrapartum course. The delivery was at 41 gestational weeks.  Anesthesia: epidural. Postpartum course has been normal. Baby's course has been normal. Baby is feeding by breast. Bleeding no bleeding. Bowel function is normal. Bladder function is normal. Patient is not sexually active. Contraception method is abstinence. Postpartum depression screening:neg  The following portions of the patient's history were reviewed and updated as appropriate: allergies, current medications, past family history, past medical history, past social history, past surgical history and problem list.  Review of Systems Pertinent items noted in HPI and remainder of comprehensive ROS otherwise negative.    Objective:    BP 116/78 mmHg  Pulse 78  Resp 16  Ht 5\' 5"  (1.651 m)  Wt 211 lb (95.709 kg)  BMI 35.11 kg/m2  Breastfeeding? Yes  General:  alert, cooperative and no distress   Breasts:  inspection negative, no nipple discharge or bleeding, no masses or nodularity palpable  Lungs: clear to auscultation bilaterally  Heart:  regular rate and rhythm, S1, S2 normal, no murmur, click, rub or gallop  Abdomen: soft, non-tender; bowel sounds normal; no masses,  no organomegaly        Assessment:    Normal 6 week postpartum exam. Pap smear not done at today's visit.  Contraception counseling  Plan:   1. Contraception: abstinence, IUD and Paragard planned 2. Annual exam after 04/30/15 3. Follow up in: 2 weeks for IUD or as needed.

## 2016-01-05 ENCOUNTER — Encounter: Payer: Self-pay | Admitting: Certified Nurse Midwife

## 2016-01-05 ENCOUNTER — Ambulatory Visit (INDEPENDENT_AMBULATORY_CARE_PROVIDER_SITE_OTHER): Payer: Medicaid Other | Admitting: Certified Nurse Midwife

## 2016-01-05 DIAGNOSIS — Z30430 Encounter for insertion of intrauterine contraceptive device: Secondary | ICD-10-CM | POA: Diagnosis not present

## 2016-01-05 MED ORDER — PARAGARD INTRAUTERINE COPPER IU IUD
1.0000 | INTRAUTERINE_SYSTEM | Freq: Once | INTRAUTERINE | Status: AC
  Administered 2016-01-05: 1 via INTRAUTERINE

## 2016-01-05 NOTE — Progress Notes (Signed)
IUD Procedure Note   DIAGNOSIS: Desires long-term, reversible contraception   PROCEDURE: IUD placement Performing Provider: Orvilla Cornwallachelle Vonnetta Akey CNM  Patient counseled prior to procedure. I explained risks and benefits of ParaGard IUD, reviewed alternative forms of contraception. Patient stated understanding and consented to continue with procedure.   LMP: unknown, postpartum, not currently sexually active Pregnancy Test: Negative Lot #: [id number] Expiration Date: April 2024   IUD type: [   ] Mirena   [X]  Paragard  [   ] Christean GriefSkyla   [   ]  Kyleena  PROCEDURE:  Timeout procedure was performed to ensure right patient and right site.  A bimanual exam was performed to determine the position of the uterus, retroverted. The speculum was placed. The vagina and cervix was sterilized in the usual manner and sterile technique was maintained throughout the course of the procedure. A single toothed tenaculum was not required. The depth of the uterus was sounded to 9 cm. The IUD was inserted to the appropriate depth and inserted without difficulty.  The string was cut to an estimated 4 cm length. Bleeding was minimal. The patient tolerated the procedure well.   Follow up: The patient tolerated the procedure well without complications.  Standard post-procedure care is explained and return precautions are given.  Orvilla Cornwallachelle Selah Klang CNM

## 2016-02-10 ENCOUNTER — Ambulatory Visit (INDEPENDENT_AMBULATORY_CARE_PROVIDER_SITE_OTHER): Payer: BLUE CROSS/BLUE SHIELD | Admitting: Certified Nurse Midwife

## 2016-02-10 ENCOUNTER — Encounter: Payer: Self-pay | Admitting: Certified Nurse Midwife

## 2016-02-10 VITALS — BP 116/72 | HR 80 | Temp 98.8°F

## 2016-02-10 DIAGNOSIS — Z30431 Encounter for routine checking of intrauterine contraceptive device: Secondary | ICD-10-CM

## 2016-02-10 NOTE — Progress Notes (Signed)
Subjective:   Erin Bridges is a 41 y.o. female who presents for contraception counseling. The patient has no complaints today. The patient is sexually active. Pertinent past medical history: none.  Likes her Paragard IUD.  Is not currently bleeding with the IUD.    The information documented in the HPI was reviewed and verified.  Menstrual History: OB History    Gravida Para Term Preterm AB Living   1 1 1     1    SAB TAB Ectopic Multiple Live Births         0 1      No LMP recorded. has Pargard IUD    Current Outpatient Prescriptions:  .  Prenat w/o A-[id number]-[id number]-FA-DHA (CITRANATAL HARMONY) 30-1-260 MG CAPS, Take 1 tablet by mouth daily., Disp: 30 capsule, Rfl: 12 .  ibuprofen (ADVIL,MOTRIN) 800 MG tablet, Take 1 tablet (800 mg total) by mouth every 8 (eight) hours as needed. (Patient not taking: Reported on 12/24/2015), Disp: 60 tablet, Rfl: 1 .   Allergies  Allergen Reactions  . NKDA     Social History  Substance Use Topics  . Smoking status: Never Smoker  . Smokeless tobacco: Never Used  . Alcohol use 0.0 oz/week     Comment: wine, occ    No family history on file.     Review of Systems Constitutional: negative for weight loss Genitourinary:negative for abnormal menstrual periods and vaginal discharge   Objective:   BP 115/77   Pulse 89   Wt 161 lb (73 kg)   BMI 25.22 kg/m    General:   alert  Skin:   no rash or abnormalities  Lungs:   clear to auscultation bilaterally  Heart:   regular rate and rhythm, S1, S2 normal, no murmur, click, rub or gallop  Breasts:   deferred  Abdomen:  normal findings: no organomegaly, soft, non-tender and no hernia  Pelvis:  External genitalia: normal general appearance Urinary system: urethral meatus normal and bladder without fullness, nontender Vaginal: normal without tenderness, induration or masses Cervix: normal appearance, IUD strings present Adnexa: normal bimanual exam Uterus: anteverted and non-tender, normal size    Lab Review Urine pregnancy test Labs reviewed yes Radiologic studies reviewed no  50% of 15 min visit spent on counseling and coordination of care.    Assessment:    41 y.o., continuing IUD, no contraindications.   Plan:    All questions answered.   Follow up as needed or in 6 months for annual exam or PRN.

## 2016-02-11 ENCOUNTER — Encounter: Payer: Self-pay | Admitting: Certified Nurse Midwife

## 2016-09-27 IMAGING — US US MFM OB DETAIL+14 WK
1 series · 14 of 28 positions shown · non-contrast
Comparison: none

[Series 1: us mfm ob detail+14 wk · 58 acquisitions, 14 frames shown]
[im 3/58]
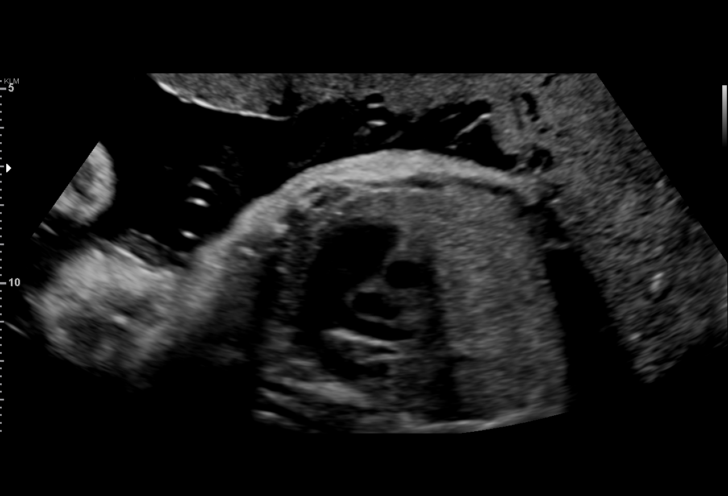
[im 7/58]
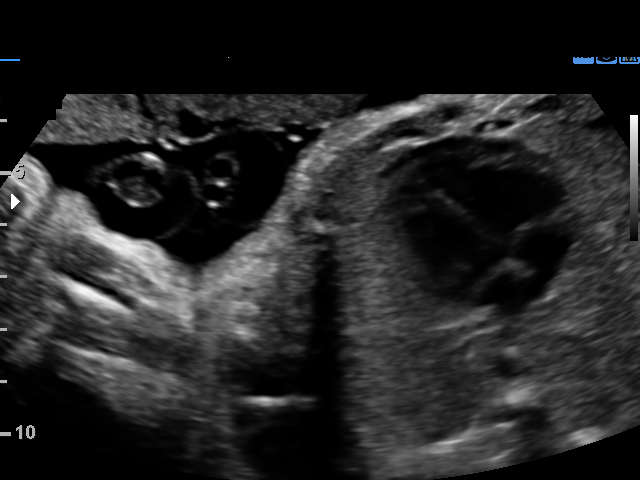
[im 11/58]
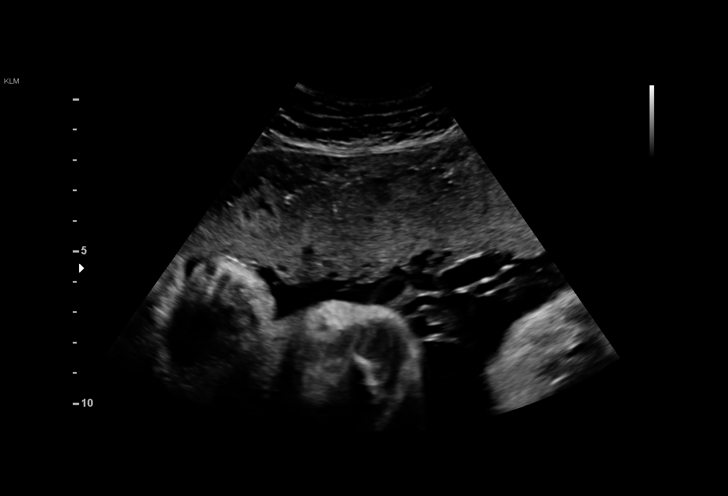
[im 15/58]
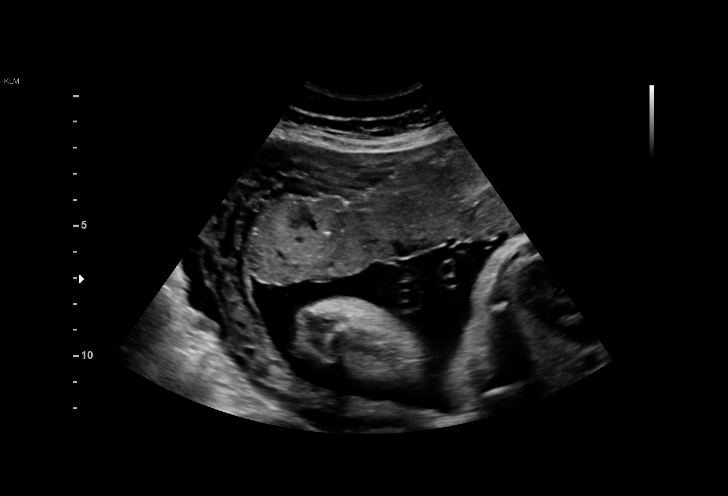
[im 20/58]
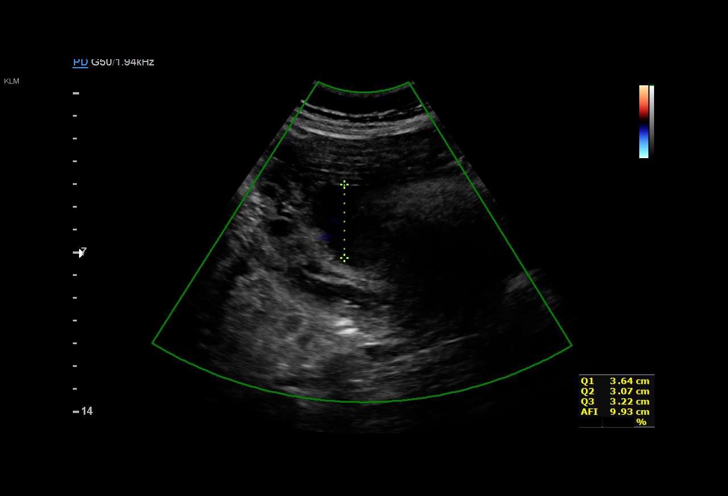
[im 24/58]
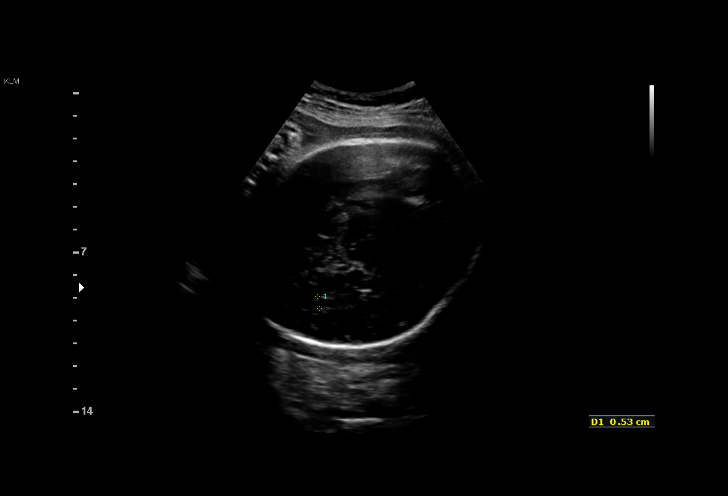
[im 28/58]
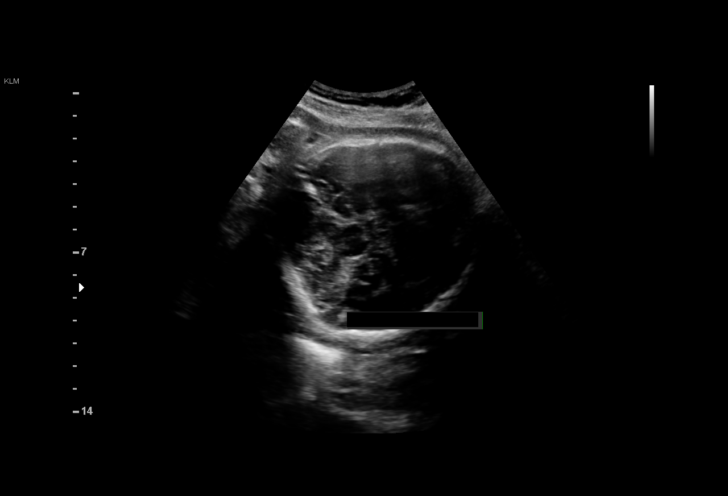
[im 32/58]
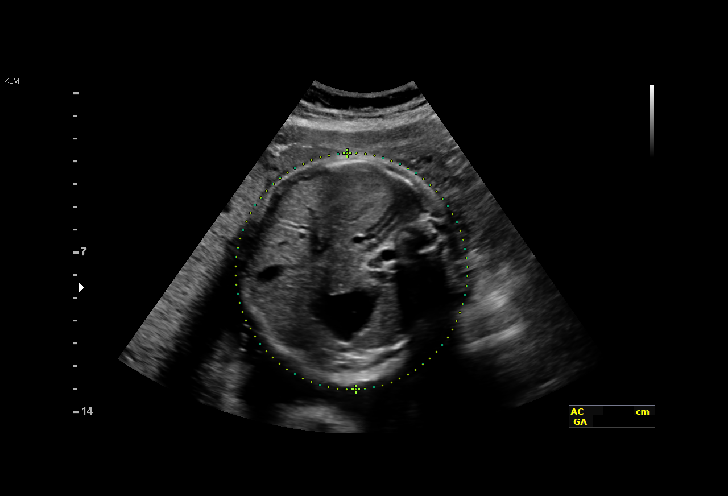
[im 36/58]
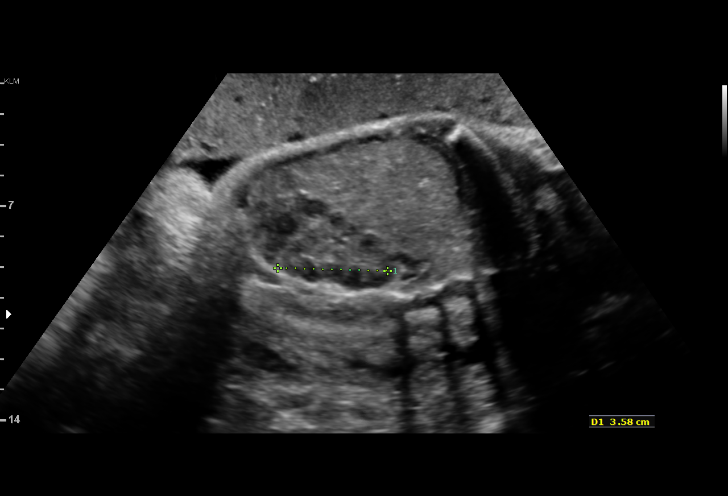
[im 41/58]
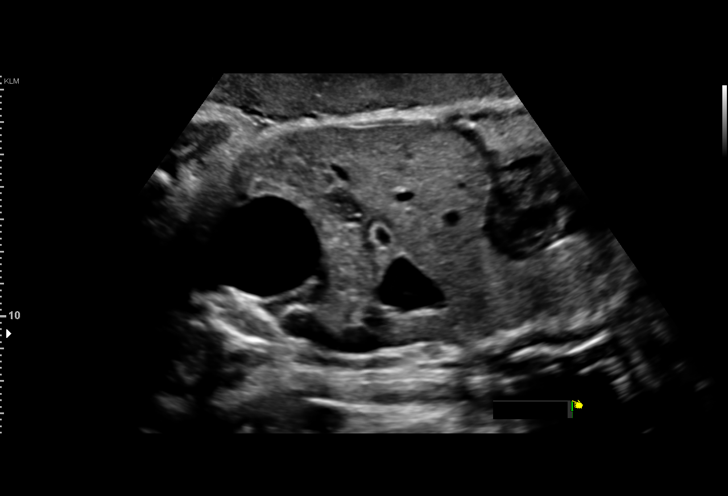
[im 45/58]
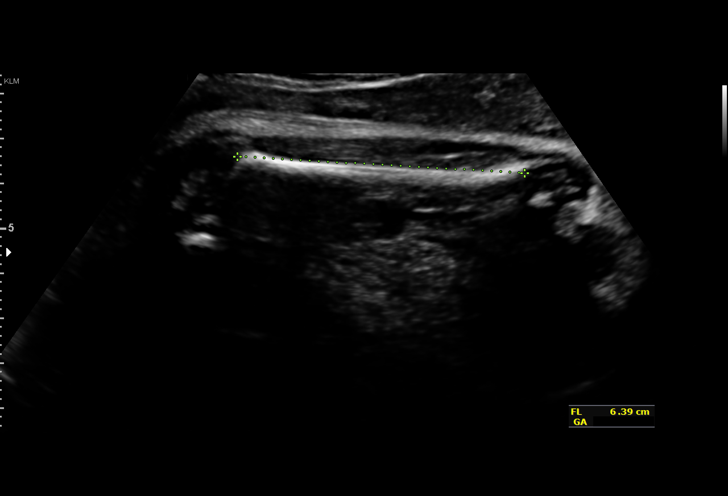
[im 49/58]
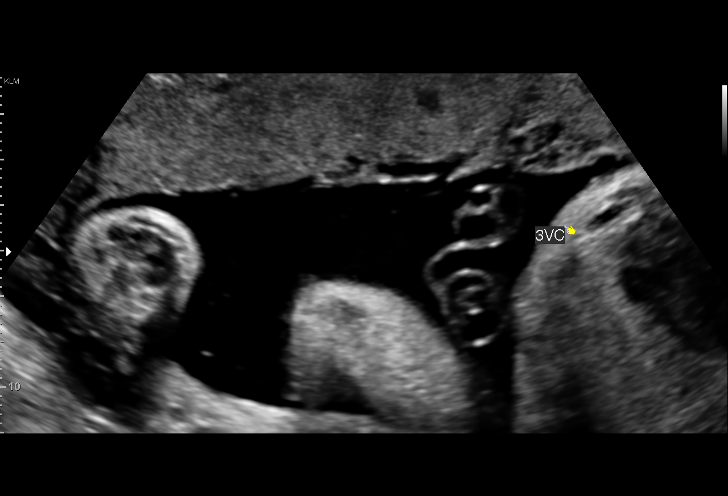
[im 53/58]
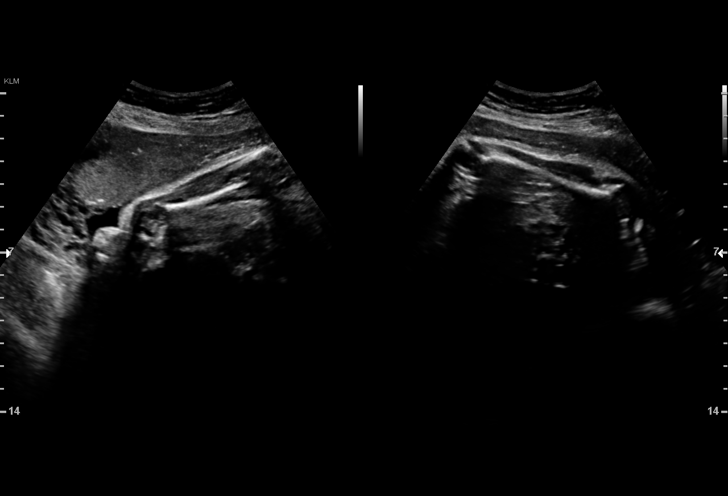
[im 58/58]
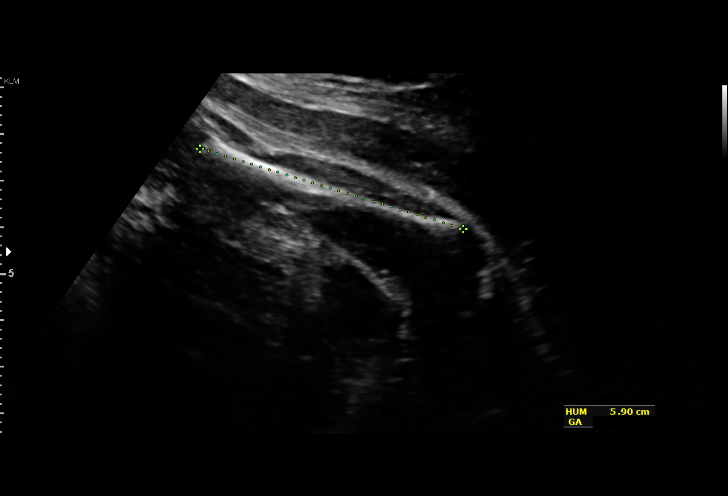

[14 of 28 positions shown; findings below may reference images not displayed]

Road [HOSPITAL]

Indications

35 weeks gestation of pregnancy
Advanced maternal age multigravida 35+,
third trimester; declined PAJA and testing
Previous cesarean delivery, antepartum
OB History

Gravidity:    4         Term:   3        Prem:   0        SAB:   0
TOP:          0       Ectopic:  0        Living: 3
Fetal Evaluation

Num Of Fetuses:     1
Fetal Heart         154
Rate(bpm):
Cardiac Activity:   Observed
Presentation:       Cephalic
Placenta:           Anterior, above cervical os
P. Cord Insertion:  Visualized, central

Amniotic Fluid
AFI FV:      Subjectively within normal limits

AFI Sum(cm)     %Tile       Largest Pocket(cm)
11.81           34

RUQ(cm)       RLQ(cm)       LUQ(cm)        LLQ(cm)
3.64
Biometry
BPD:      90.9  mm     G. Age:  36w 6d         88  %    CI:        81.28   %   70 - 86
FL/HC:      20.3   %   20.1 -
HC:      318.3  mm     G. Age:  35w 6d         27  %    HC/AC:      0.97       0.93 -
AC:      328.9  mm     G. Age:  36w 6d         89  %    FL/BPD:     71.1   %   71 - 87
FL:       64.6  mm     G. Age:  33w 2d          6  %    FL/AC:      19.6   %   20 - 24
HUM:      58.7  mm     G. Age:  34w 0d         38  %

Est. FW:    7206  gm      6 lb 2 oz     71  %
Gestational Age

LMP:           39w 2d       Date:   12/27/14                 EDD:   10/03/15
U/S Today:     35w 5d                                        EDD:   10/28/15
Best:          35w 3d    Det. By:   Early Ultrasound         EDD:   10/30/15
(04/29/15)
Anatomy

Cranium:               Appears normal         Aortic Arch:            Appears normal
Cavum:                 Not well visualized    Ductal Arch:            Appears normal
Ventricles:            Appears normal         Diaphragm:              Appears normal
Choroid Plexus:        Appears normal         Stomach:                Appears normal, left
sided
Cerebellum:            Appears normal         Abdomen:                Appears normal
Posterior Fossa:       Not well visualized    Abdominal Wall:         Not well visualized
Nuchal Fold:           Not applicable (>20    Cord Vessels:           Appears normal (3
wks GA)                                        vessel cord)
Face:                  Orbits nl; profile not Kidneys:                Appear normal
well visualized
Lips:                  Not well visualized    Bladder:                Appears normal
Thoracic:              Appears normal         Spine:                  Not well visualized
Heart:                 Appears normal         Upper Extremities:      Visualized
(4CH, axis, and situs
RVOT:                  Appears normal         Lower Extremities:      Visualized
LVOT:                  Appears normal

Other:  Fetus appears to be a female. Technically difficult due to advanced
GA and fetal position.
Cervix Uterus Adnexa

Cervix
Not visualized (advanced GA >12wks)
Impression

SIUP at 35+3 weeks
Cephalic presentation
Normal detailed fetal anatomy; limited views of CSP, face,
PF, CI and spine
Normal amniotic fluid volume
Measurements consistent with early US; EFW at the 71st
%tile
Recommendations

Follow-up ultrasounds as clinically indicated.
